# Patient Record
Sex: Male | Born: 1980 | Hispanic: Yes | Marital: Married | Smoking: Never smoker
Health system: Southern US, Community
[De-identification: ages and names within clinical notes are randomized; demographics above are authoritative.]

## PROBLEM LIST (undated history)

## (undated) DIAGNOSIS — I10 Essential (primary) hypertension: Secondary | ICD-10-CM

---

## 2020-07-09 ENCOUNTER — Inpatient Hospital Stay
Admission: EM | Admit: 2020-07-09 | Discharge: 2020-07-12 | DRG: 440 | Disposition: A | Discharge status: Home / Self Care | Payer: Self-pay | Attending: Internal Medicine | Admitting: Internal Medicine

## 2020-07-09 ENCOUNTER — Emergency Department: Payer: Self-pay

## 2020-07-09 ENCOUNTER — Other Ambulatory Visit: Payer: Self-pay

## 2020-07-09 DIAGNOSIS — F10239 Alcohol dependence with withdrawal, unspecified: Secondary | ICD-10-CM

## 2020-07-09 DIAGNOSIS — R7401 Elevation of levels of liver transaminase levels: Secondary | ICD-10-CM | POA: Diagnosis present

## 2020-07-09 DIAGNOSIS — Z20822 Contact with and (suspected) exposure to covid-19: Secondary | ICD-10-CM | POA: Diagnosis present

## 2020-07-09 DIAGNOSIS — K859 Acute pancreatitis without necrosis or infection, unspecified: Secondary | ICD-10-CM

## 2020-07-09 DIAGNOSIS — Z8249 Family history of ischemic heart disease and other diseases of the circulatory system: Secondary | ICD-10-CM

## 2020-07-09 DIAGNOSIS — K8020 Calculus of gallbladder without cholecystitis without obstruction: Secondary | ICD-10-CM

## 2020-07-09 DIAGNOSIS — F1020 Alcohol dependence, uncomplicated: Secondary | ICD-10-CM | POA: Diagnosis present

## 2020-07-09 DIAGNOSIS — E860 Dehydration: Secondary | ICD-10-CM

## 2020-07-09 DIAGNOSIS — F10939 Alcohol use, unspecified with withdrawal, unspecified: Secondary | ICD-10-CM

## 2020-07-09 DIAGNOSIS — K760 Fatty (change of) liver, not elsewhere classified: Secondary | ICD-10-CM | POA: Diagnosis present

## 2020-07-09 DIAGNOSIS — R109 Unspecified abdominal pain: Secondary | ICD-10-CM

## 2020-07-09 DIAGNOSIS — I10 Essential (primary) hypertension: Secondary | ICD-10-CM | POA: Diagnosis present

## 2020-07-09 DIAGNOSIS — K852 Alcohol induced acute pancreatitis without necrosis or infection: Principal | ICD-10-CM | POA: Diagnosis present

## 2020-07-09 DIAGNOSIS — K7010 Alcoholic hepatitis without ascites: Secondary | ICD-10-CM | POA: Diagnosis present

## 2020-07-09 DIAGNOSIS — F1010 Alcohol abuse, uncomplicated: Secondary | ICD-10-CM

## 2020-07-09 HISTORY — DX: Essential (primary) hypertension: I10

## 2020-07-09 LAB — COMPREHENSIVE METABOLIC PANEL
ALT: 47 U/L — ABNORMAL HIGH (ref 0–44)
AST: 85 U/L — ABNORMAL HIGH (ref 15–41)
Albumin: 4.5 g/dL (ref 3.5–5.0)
Alkaline Phosphatase: 104 U/L (ref 38–126)
Anion gap: 15 (ref 5–15)
BUN: 15 mg/dL (ref 6–20)
CO2: 20 mmol/L — ABNORMAL LOW (ref 22–32)
Calcium: 9.3 mg/dL (ref 8.9–10.3)
Chloride: 97 mmol/L — ABNORMAL LOW (ref 98–111)
Creatinine, Ser: 1.41 mg/dL — ABNORMAL HIGH (ref 0.61–1.24)
GFR, Estimated: >60 mL/min (ref >60)
Glucose, Bld: 130 mg/dL — ABNORMAL HIGH (ref 70–99)
Potassium: 3.8 mmol/L (ref 3.5–5.1)
Sodium: 132 mmol/L — ABNORMAL LOW (ref 135–145)
Total Bilirubin: 2.3 mg/dL — ABNORMAL HIGH (ref 0.3–1.2)
Total Protein: 8.6 g/dL — ABNORMAL HIGH (ref 6.5–8.1)

## 2020-07-09 LAB — MAGNESIUM: Magnesium: 1.9 mg/dL (ref 1.7–2.4)

## 2020-07-09 LAB — CBC
HCT: 49 % (ref 39.0–52.0)
Hemoglobin: 17.7 g/dL — ABNORMAL HIGH (ref 13.0–17.0)
MCH: 30.3 pg (ref 26.0–34.0)
MCHC: 36.1 g/dL — ABNORMAL HIGH (ref 30.0–36.0)
MCV: 83.8 fL (ref 80.0–100.0)
Platelets: 240 10*3/uL (ref 150–400)
RBC: 5.85 MIL/uL — ABNORMAL HIGH (ref 4.22–5.81)
RDW: 12.4 % (ref 11.5–15.5)
WBC: 11.8 10*3/uL — ABNORMAL HIGH (ref 4.0–10.5)
nRBC: 0 % (ref 0.0–0.2)

## 2020-07-09 LAB — PHOSPHORUS: Phosphorus: 4.6 mg/dL (ref 2.5–4.6)

## 2020-07-09 LAB — ETHANOL: Alcohol, Ethyl (B): <10 mg/dL (ref <10)

## 2020-07-09 LAB — LIPASE, BLOOD: Lipase: 115 U/L — ABNORMAL HIGH (ref 11–51)

## 2020-07-09 IMAGING — US US ABDOMEN LIMITED
1 series · 14 of 25 positions shown · non-contrast
Comparison: CT earlier today.

CLINICAL DATA: Abdominal pain.

EXAM:
ULTRASOUND ABDOMEN LIMITED RIGHT UPPER QUADRANT

[Series 1: us abdomen limited ruq (liver/gb) · 14 of 29 slices shown]
[im 1/29]
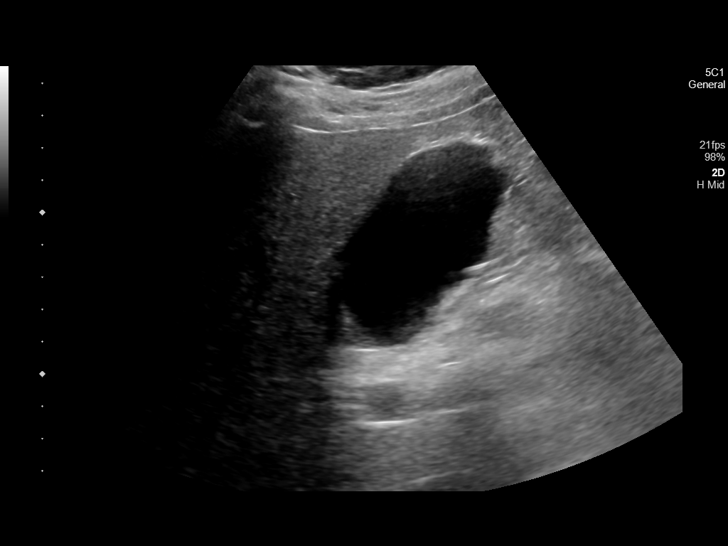
[im 3/29]
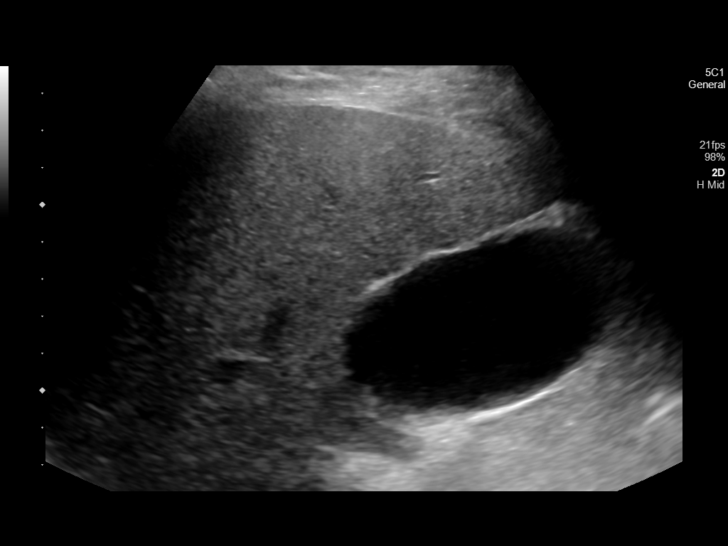
[im 5/29]
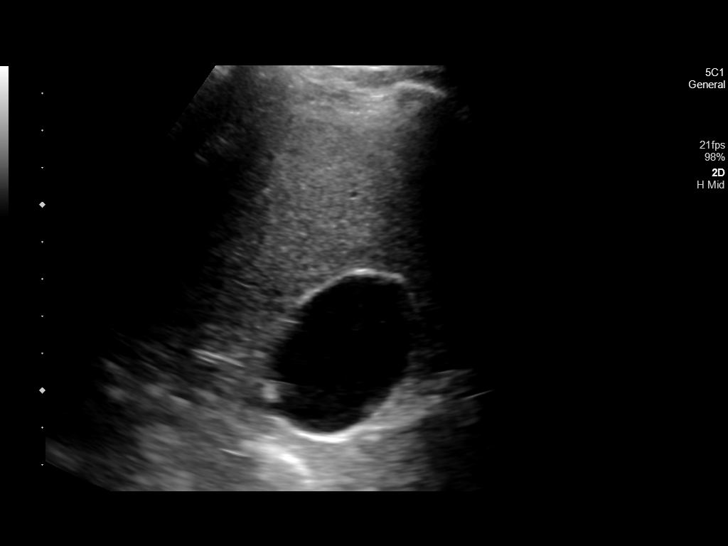
[im 8/29]
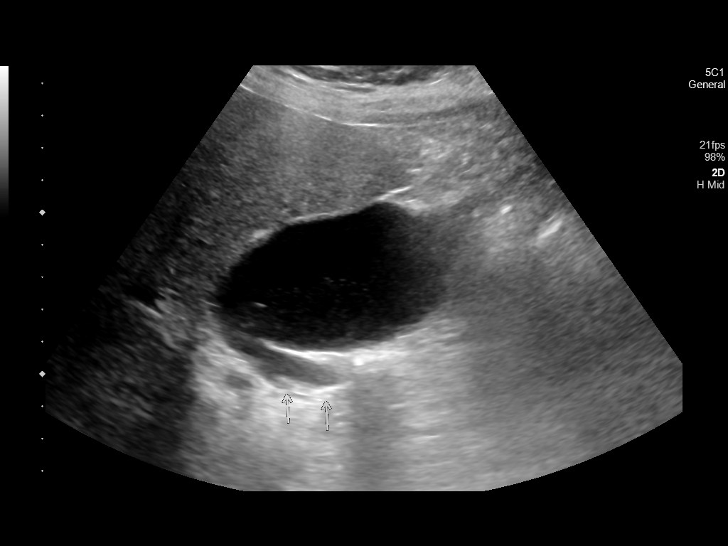
[im 10/29]
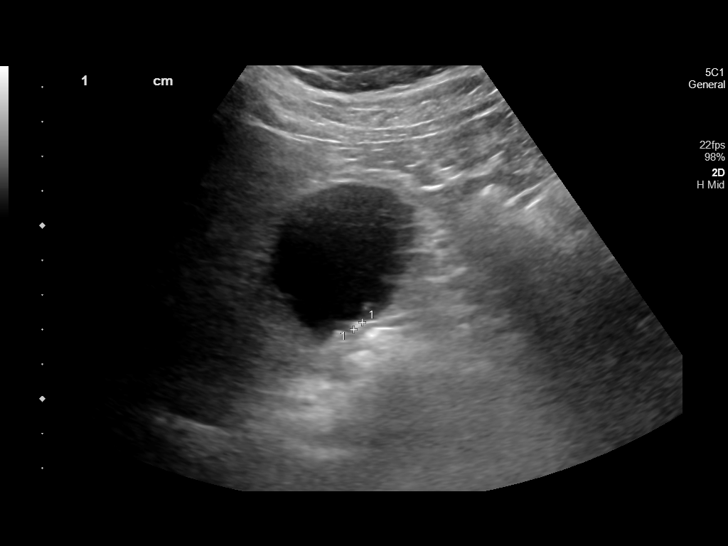
[im 11/29]
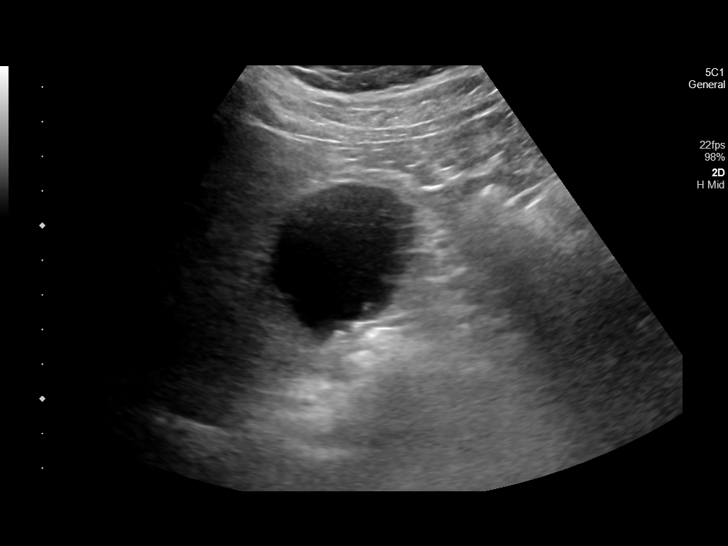
[im 13/29]
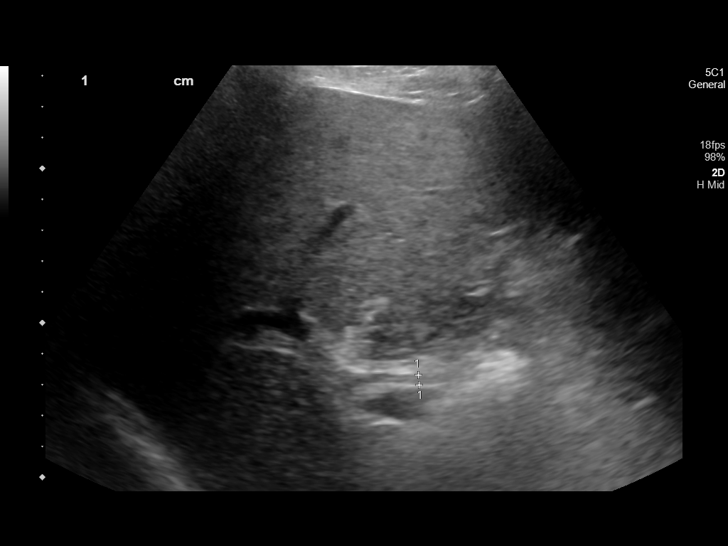
[im 16/29]
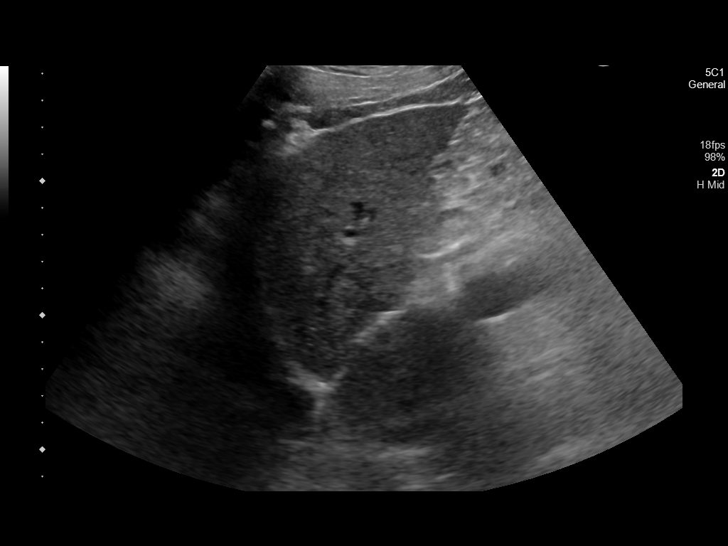
[im 18/29]
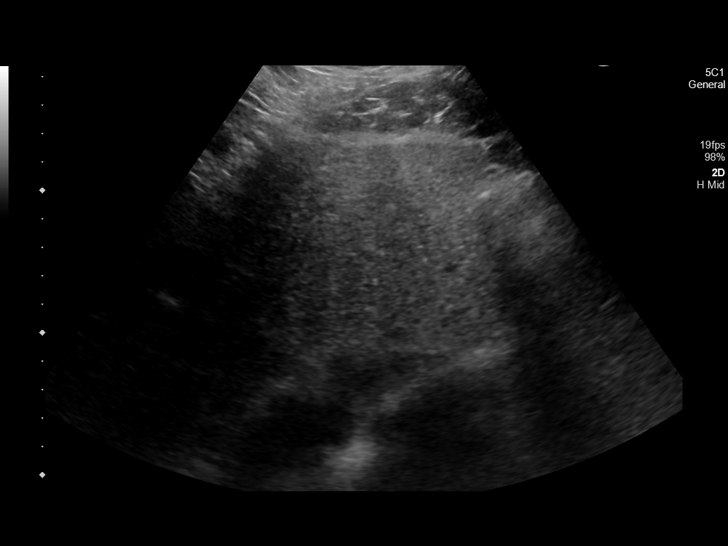
[im 19/29]
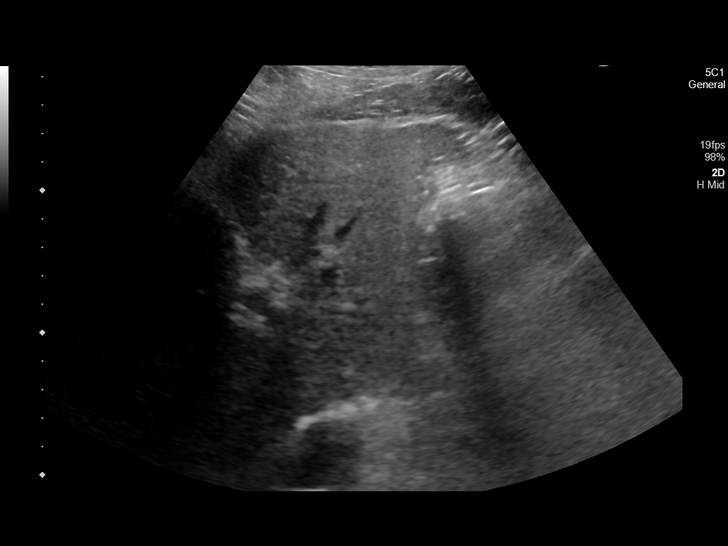
[im 22/29]
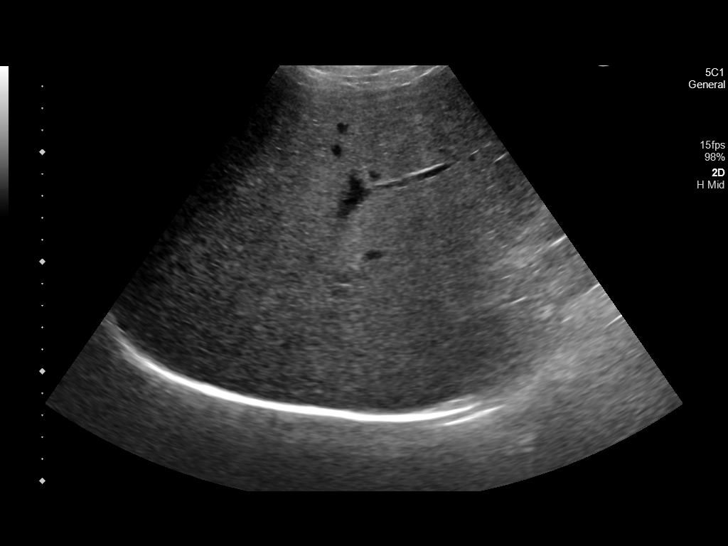
[im 24/29]
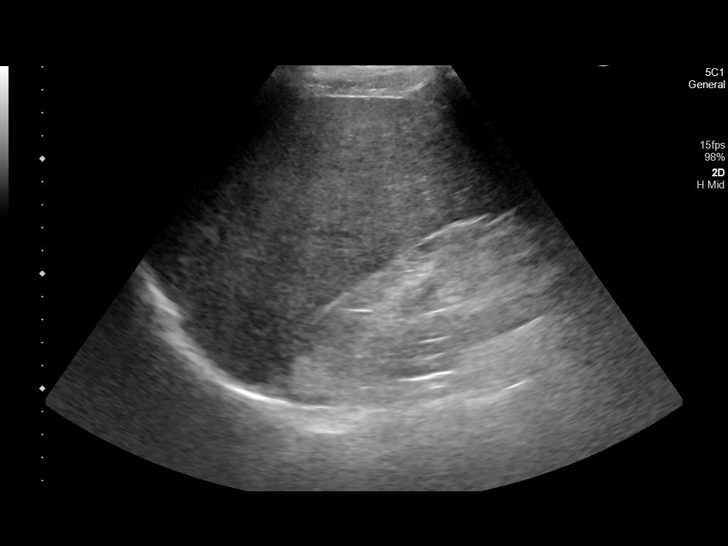
[im 26/29]
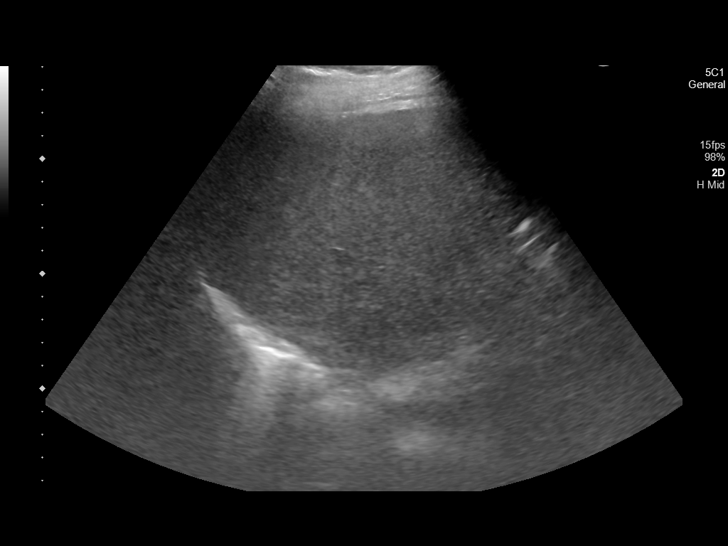
[im 29/29]
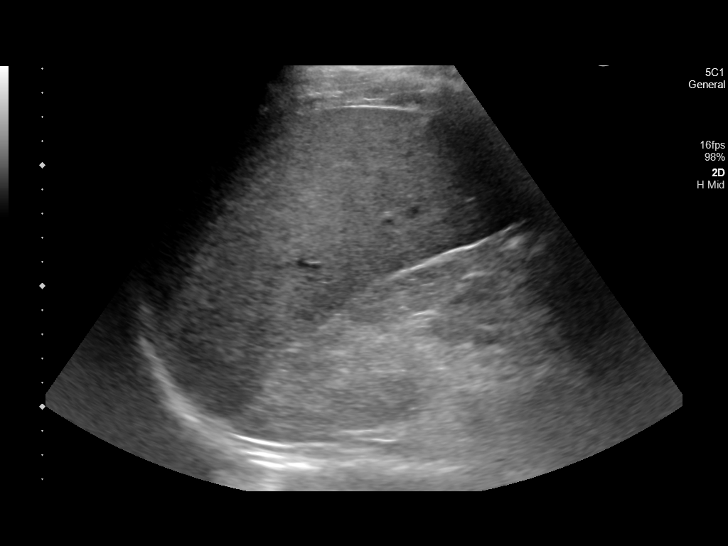

[14 of 25 positions shown; findings below may reference images not displayed]

FINDINGS: Gallbladder:

Distended with stones at the gallbladder neck. Suspected layering
sludge. No wall thickening or pericholecystic fluid. No sonographic
Murphy sign noted by sonographer.

Common bile duct:

Diameter: 3 mm at the porta hepatis, not well visualized distally

Liver:

Heterogeneous echogenicity with likely steatosis. The right kidney
is markedly atrophic for comparison. No evidence of focal lesion.
Portal vein is patent on color Doppler imaging with normal direction
of blood flow towards the liver.

Other: No right upper quadrant ascites.
IMPRESSION: 1. Distended gallbladder with gallstones but no sonographic findings
of acute cholecystitis.
2. Heterogeneous liver echogenicity with likely steatosis.

## 2020-07-09 IMAGING — CT CT ABD-PELV W/O CM
2 of 4 series · 16 of 46 positions shown, 18 images · non-contrast
Comparison: None.

CLINICAL DATA: Vomiting right-sided abdominal pain

EXAM:
CT ABDOMEN AND PELVIS WITHOUT CONTRAST
TECHNIQUE: Multidetector CT imaging of the abdomen and pelvis was performed
following the standard protocol without IV contrast.

[Series 2: routine abd/pel wo · axial · 0.93mm/px · z∈[-1008,-483]mm · 13 of 115 slices shown, 15 images]
[im 5/115  soft-tissue]
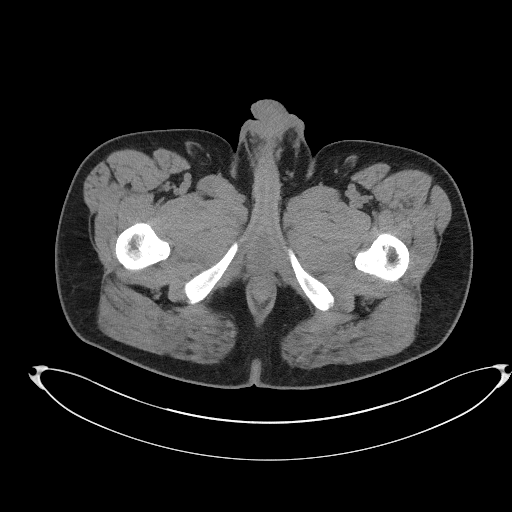
[im 5/115  bone]
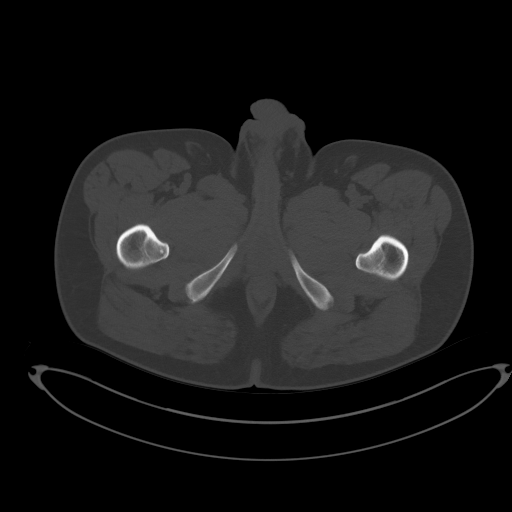
[im 15/115  soft-tissue]
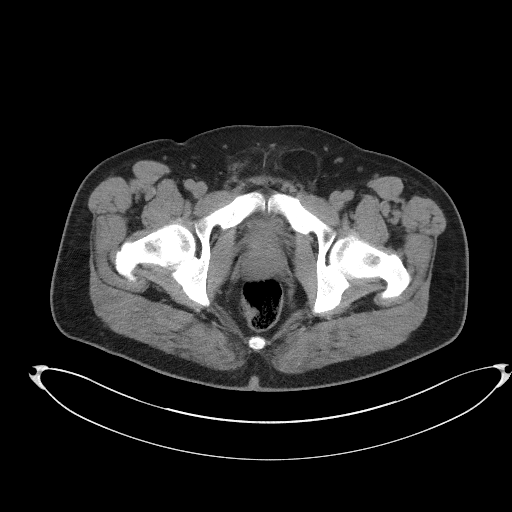
[im 25/115  soft-tissue]
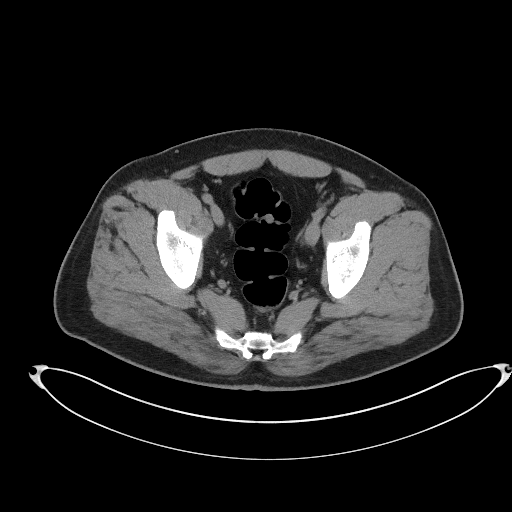
[im 30/115  soft-tissue]
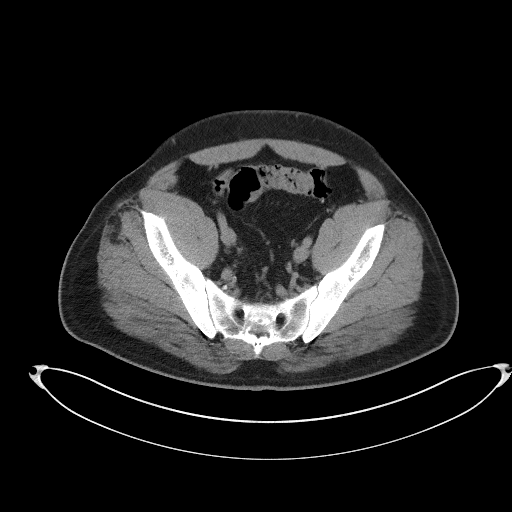
[im 40/115  soft-tissue]
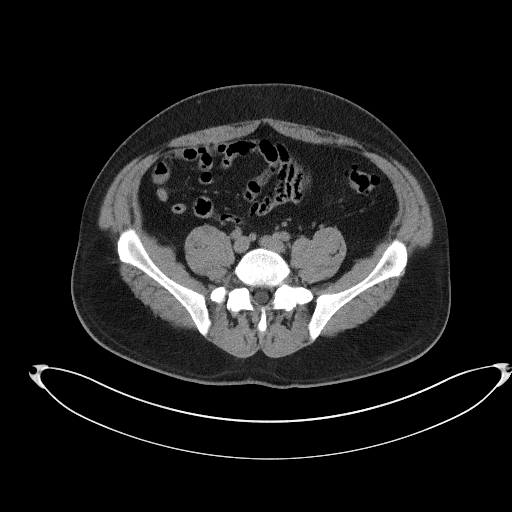
[im 50/115  soft-tissue]
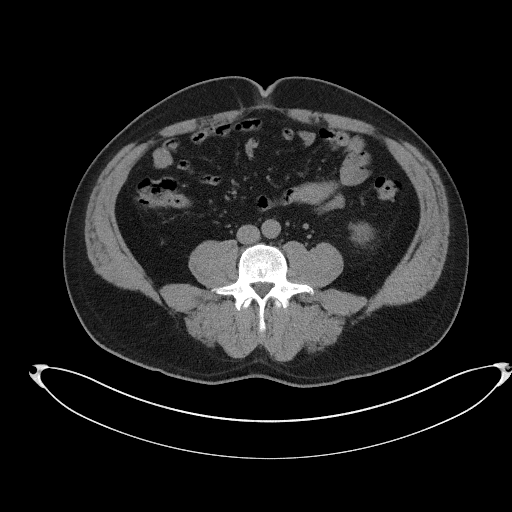
[im 60/115  soft-tissue]
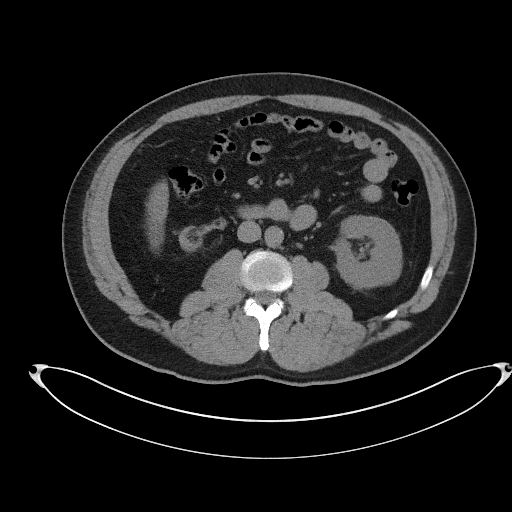
[im 65/115  soft-tissue]
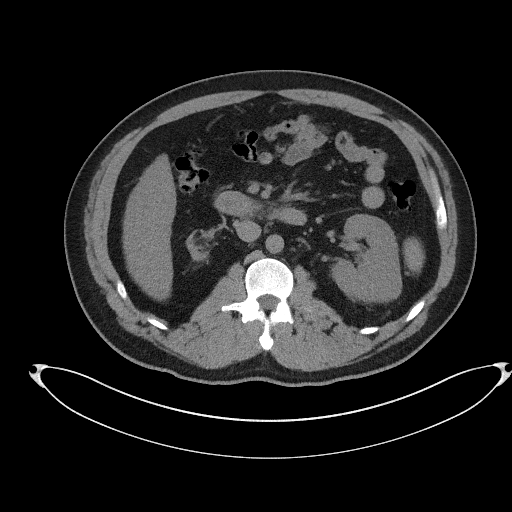
[im 75/115  soft-tissue]
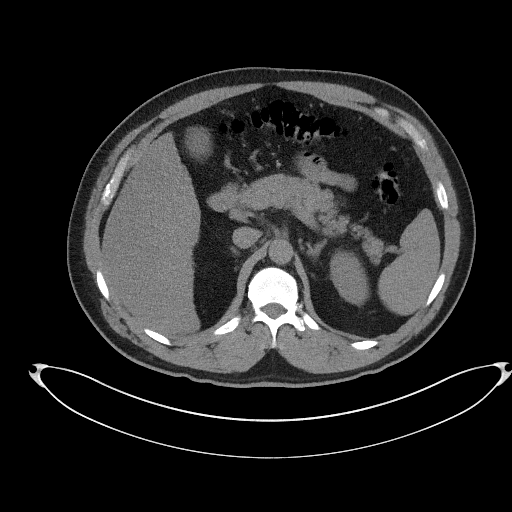
[im 75/115  bone]
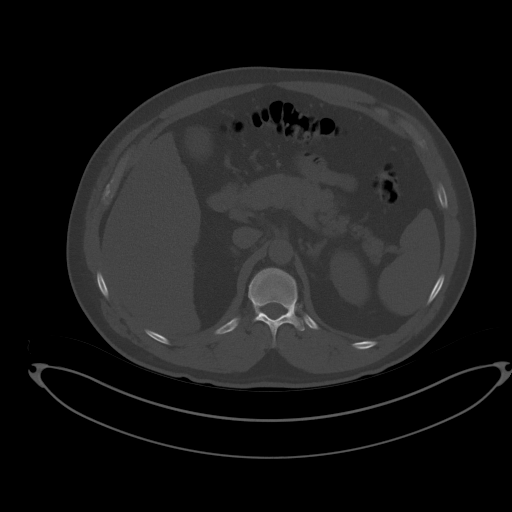
[im 85/115  soft-tissue]
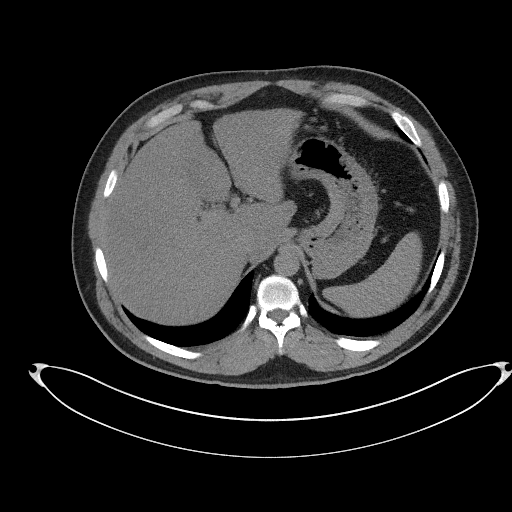
[im 90/115  soft-tissue]
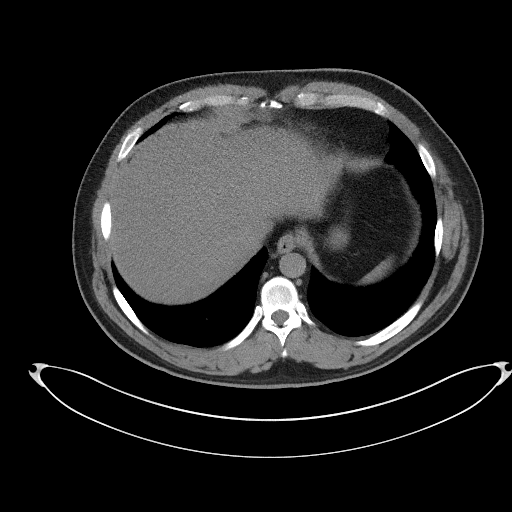
[im 100/115  soft-tissue]
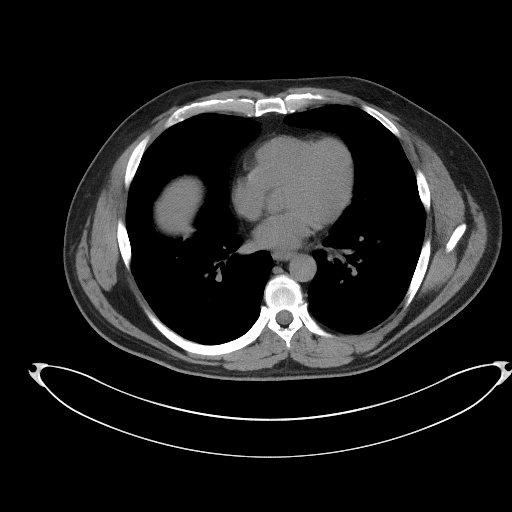
[im 110/115  soft-tissue]
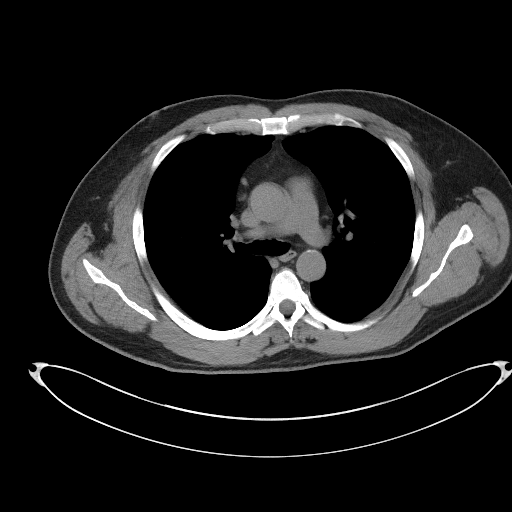

[Series 5: coronal st · coronal · 0.87mm/px · 3 of 103 slices shown]
[im 35/103  soft-tissue]
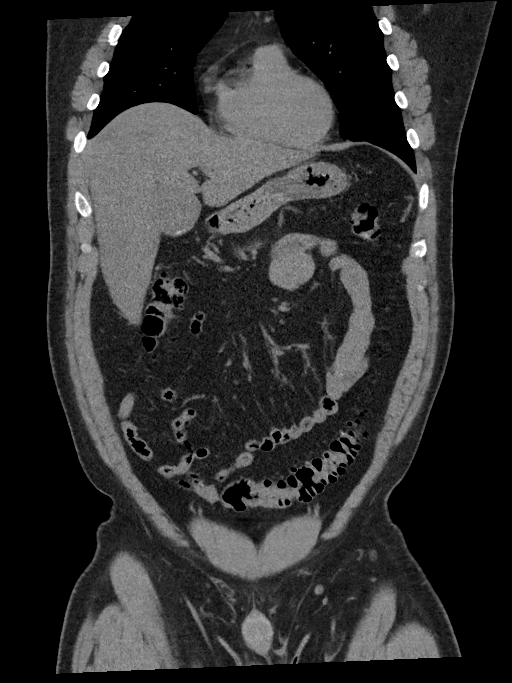
[im 46/103  soft-tissue]
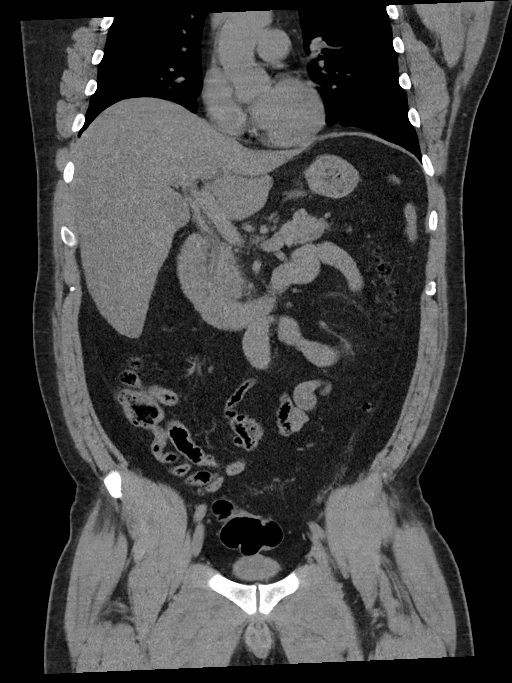
[im 57/103  soft-tissue]
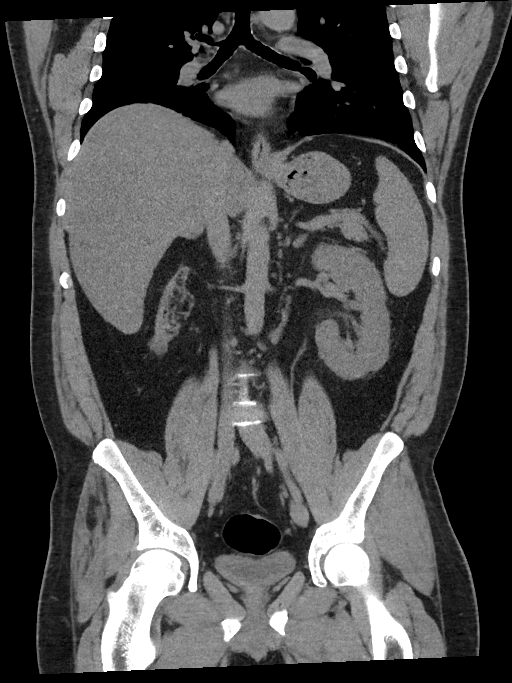

[16 of 46 positions shown; findings below may reference images not displayed]

FINDINGS: Lower chest: Lung bases demonstrate no acute consolidation or
effusion. Mild elevation of right diaphragm.

Hepatobiliary: Liver slightly enlarged at 21 cm. Possible subtle
contour nodularity. Gallbladder slightly dilated. Small stones
within the gallbladder neck and possibly the cystic duct. No right
upper quadrant inflammatory change.

Pancreas: Unremarkable. No pancreatic ductal dilatation or
surrounding inflammatory changes.

Spleen: Normal in size without focal abnormality.

Adrenals/Urinary Tract: Adrenal glands are normal. Atrophic right
kidney with punctate stone at the upper pole. Exophytic
low-attenuation lesion off the lower pole probably a cyst. No
hydronephrosis. The bladder is normal

Stomach/Bowel: Stomach is within normal limits. Appendix appears
normal. No evidence of bowel wall thickening, distention, or
inflammatory changes. Diverticular disease of the colon without
acute wall thickening.

Vascular/Lymphatic: Mild aortic atherosclerosis. No aneurysm. No
suspicious nodes

Reproductive: Prostate is unremarkable.

Other: Negative for free air or free fluid. Left fat containing
inguinal hernia

Musculoskeletal: No acute or significant osseous findings.
IMPRESSION: 1. Slightly enlarged liver. Possible subtle contour nodularity of
the liver as may be seen with cirrhosis.
2. Slightly distended gallbladder containing stones. Stones appear
to be within gallbladder neck and possibly cystic duct. Correlation
with right upper quadrant ultrasound may be obtained if symptoms are
referable to the gallbladder
3. Atrophic right kidney with stone.  No hydronephrosis
4. Diverticular disease of the colon without acute inflammatory
change

## 2020-07-09 MED ORDER — ADULT MULTIVITAMIN W/MINERALS CH
1.0000 | ORAL_TABLET | Freq: Every day | ORAL | Status: DC
  Administered 2020-07-09 – 2020-07-12 (×4): 1 via ORAL
  Filled 2020-07-09 (×4): qty 1

## 2020-07-09 MED ORDER — LORAZEPAM 2 MG/ML IJ SOLN
2.0000 mg | Freq: Once | INTRAMUSCULAR | Status: AC
  Administered 2020-07-09: 2 mg via INTRAVENOUS
  Filled 2020-07-09: qty 1

## 2020-07-09 MED ORDER — LORAZEPAM 2 MG/ML IJ SOLN: 1.0000 mg | INTRAMUSCULAR | Status: DC | PRN

## 2020-07-09 MED ORDER — FOLIC ACID 1 MG PO TABS
1.0000 mg | ORAL_TABLET | Freq: Every day | ORAL | Status: DC
  Administered 2020-07-09 – 2020-07-12 (×4): 1 mg via ORAL
  Filled 2020-07-09 (×4): qty 1

## 2020-07-09 MED ORDER — ONDANSETRON HCL 4 MG/2ML IJ SOLN
4.0000 mg | INTRAMUSCULAR | Status: AC
  Administered 2020-07-09: 4 mg via INTRAVENOUS
  Filled 2020-07-09: qty 2

## 2020-07-09 MED ORDER — THIAMINE HCL 100 MG PO TABS
100.0000 mg | ORAL_TABLET | Freq: Every day | ORAL | Status: DC
  Administered 2020-07-09 – 2020-07-12 (×4): 100 mg via ORAL
  Filled 2020-07-09 (×4): qty 1

## 2020-07-09 MED ORDER — LORAZEPAM 1 MG PO TABS
1.0000 mg | ORAL_TABLET | ORAL | Status: DC | PRN
  Administered 2020-07-09: 2 mg via ORAL
  Filled 2020-07-09: qty 2

## 2020-07-09 MED ORDER — SODIUM CHLORIDE 0.9 % IV BOLUS
1000.0000 mL | Freq: Once | INTRAVENOUS | Status: AC
  Administered 2020-07-09: 1000 mL via INTRAVENOUS

## 2020-07-09 MED ORDER — THIAMINE HCL 100 MG/ML IJ SOLN
100.0000 mg | Freq: Every day | INTRAMUSCULAR | Status: DC
  Filled 2020-07-09: qty 2

## 2020-07-09 MED ORDER — MORPHINE SULFATE (PF) 4 MG/ML IV SOLN: 4.0000 mg | Freq: Once | INTRAVENOUS | Status: DC

## 2020-07-09 NOTE — ED Notes (Signed)
EKG to EDP Isaacs in person.  

## 2020-07-09 NOTE — ED Notes (Signed)
Pt given urine specimen cup and lav/grn/red tubes sent to lab.

## 2020-07-09 NOTE — ED Notes (Signed)
Reports he does not have abdominal pain at this time, morphine not given.

## 2020-07-09 NOTE — ED Notes (Signed)
Called lab re: lipase and ETOH add on.  They will run immed

## 2020-07-09 NOTE — ED Provider Notes (Signed)
-----------------------------------------   11:18 PM on 07/09/2020 -----------------------------------------  Assuming care from Dr. Fanny Bien.  In short, Joshua Yoder is a 40 y.o. male with a chief complaint of abdominal pain w/ N/V.  Refer to the original H&P for additional details.  The current plan of care is to follow up ultrasound.  Anticipate admission.    ----------------------------------------- 12:45 AM on 07/10/2020 -----------------------------------------   Ultrasound showed a 3 mm common bile duct and cholelithiasis without evidence of cholecystitis.  I ordered the MRCP and I contacted the hospitalist for admission but he pointed out that the patient may need an ERCP, which is only available if Dr. Servando Snare is around, which she does not seem to be over the weekend.  I called and spoke by phone with Dr. Norma Fredrickson with gastroenterology.  He agreed that we should go ahead and get the MRCP.  If positive for choledocholithiasis, the patient will likely need to be transferred to a facility with ERCP available over the weekend.  If the MRCP is negative, we can admit to the hospitalist service.  Dr. Norma Fredrickson pointed out that if it is negative, gastroenterology likely does not need to be contacted by the hospitalist service, as the hospital service can treat the alcohol withdrawal, the pancreatitis, and consult surgery as needed for alcoholic or gallbladder pancreatitis.  I updated Dr. Allena Katz with the hospitalist service and order the MRCP and what that will be the decision point.  I also ordered 1 L lactated Ringer's in addition to the fluids he is already received.    ----------------------------------------- 4:58 AM on 07/10/2020 -----------------------------------------  Discussed case in person with Dr. Para March who will admit the patient.   Joshua Rose, MD 07/10/20 [phone]

## 2020-07-09 NOTE — ED Triage Notes (Signed)
Pt in from home d/t R sided abd pain. Told by a healthcare provider previously that it is due to all of the ETOH use. Pain x3 days per pt. Pt c/o throat discomfort, weakness, and shakiness. States last drink was Friday. Reports has HTN and currently has a HA. Reports stopped drinking in January but then started back up Friday.

## 2020-07-09 NOTE — ED Notes (Signed)
[id number]. Interpreting system used during triage.

## 2020-07-09 NOTE — ED Provider Notes (Signed)
[facility] Emergency Department Provider Note   ____________________________________________   Event Date/Time   First MD Initiated Contact with Patient 07/09/20 2056     (approximate)  I have reviewed the triage vital signs and the nursing notes.   HISTORY  Chief Complaint Abdominal Pain (R side of abd)  Spanish interpreter [place] utilized  HPI Joshua Yoder is a 40 y.o. male history of hypertension  Patient reports that he has been having pain in his mid upper abdomen for a couple days  maybe 3 days.  It is located primarily in his mid abdomen to right upper abdomen.  He also reports he feels very shaky, fatigued, nauseated and has vomited several times  In addition to this, he reports initially that he had drank about 4 large beers daily for the last 4 to 5 days, but on further than also reports that he drinks about a liter of tequila as well daily.  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  No chest pain or shortness of breath.  Reports nausea vomiting upper abdominal pain.  He also reports feeling like he has a "hangover" feeling light weak headed tremulous.  Denies history of withdrawal seizures.  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 Hypertension     There are no problems to display for this patient.   History reviewed. No pertinent surgical history.  Prior to Admission medications   Not on File    Allergies Patient has no allergy information on record.  No family history on file.  Social History Social History   Tobacco Use  . Smoking status: Never Smoker  . Smokeless tobacco: Never Used  Substance Use Topics  . Alcohol use: Yes  . Drug use: Not Currently    Review of Systems Constitutional: No  fever/chills Eyes: No visual changes. ENT: No sore throat. Cardiovascular: Denies chest pain. Respiratory: Denies shortness of breath. Gastrointestinal: See HPI Genitourinary: Negative for dysuria. Musculoskeletal: Negative for back pain. Skin: Negative for rash. Neurological: Negative for headaches, areas of focal weakness or numbness.  Feels tremulous.    ____________________________________________   PHYSICAL EXAM:  VITAL SIGNS: ED Triage Vitals  Enc Vitals Group     BP 07/09/20 1734 (!) 143/100     Pulse Rate 07/09/20 1734 (!) 132     Resp 07/09/20 1734 18     Temp 07/09/20 1734 97.6 F (36.4 C)     Temp Source 07/09/20 1734 Oral     SpO2 07/09/20 1734 97 %     Weight 07/09/20 1744 175 lb (79.4 kg)     Height 07/09/20 1744 5\' 7"  (1.702 m)     Head Circumference --      Peak Flow --      Pain Score 07/09/20 1744 5     Pain Loc --      Pain Edu? --      Excl. in GC? --     Constitutional: Alert and oriented.  Mildly ill-appearing.  No acute distress though. Eyes: Conjunctivae are slightly injected bilateral. Head: Atraumatic. Nose: No congestion/rhinnorhea. Mouth/Throat: Mucous membranes are moist. Neck: No stridor.  Cardiovascular: Tachycardic rate, regular rhythm. Grossly normal heart sounds.  Good peripheral circulation. Respiratory: Normal respiratory  effort.  No retractions. Lungs CTAB. Gastrointestinal: Soft.  Nondistended.  Reports moderate tenderness in the epigastrium right mid abdomen.  No rebound or guarding. Musculoskeletal: No lower extremity tenderness nor edema. Neurologic:  Normal speech and language. No gross focal neurologic deficits are appreciated.  Slightly tremulous in his hands. Skin:  Skin is warm, dry and intact. No rash noted. Psychiatric: Mood and affect are normal. Speech and behavior are normal.  ____________________________________________   LABS (all labs ordered are listed, but only abnormal results are displayed)  Labs  Reviewed  COMPREHENSIVE METABOLIC PANEL - Abnormal; Notable for the following components:      Result Value   Sodium 132 (*)    Chloride 97 (*)    CO2 20 (*)    Glucose, Bld 130 (*)    Creatinine, Ser 1.41 (*)    Total Protein 8.6 (*)    AST 85 (*)    ALT 47 (*)    Total Bilirubin 2.3 (*)    All other components within normal limits  CBC - Abnormal; Notable for the following components:   WBC 11.8 (*)    RBC 5.85 (*)    Hemoglobin 17.7 (*)    MCHC 36.1 (*)    All other components within normal limits  LIPASE, BLOOD - Abnormal; Notable for the following components:   Lipase 115 (*)    All other components within normal limits  RESP PANEL BY RT-PCR (FLU A&B, COVID) ARPGX2  ETHANOL  MAGNESIUM  PHOSPHORUS  LACTIC ACID, PLASMA   ____________________________________________  EKG EKG is reviewed inter by me Sinus tach rate 120 No obvious acute ischemic changes PR interval 142 ms QRS duration 82 ms QT/QTcB 320/454 ms ____________________________________________  RADIOLOGY  CT ABDOMEN PELVIS WO CONTRAST  Result Date: 07/09/2020 CLINICAL DATA:  Vomiting right-sided abdominal pain EXAM: CT ABDOMEN AND PELVIS WITHOUT CONTRAST TECHNIQUE: Multidetector CT imaging of the abdomen and pelvis was performed following the standard protocol without IV contrast. COMPARISON:  None. FINDINGS: Lower chest: Lung bases demonstrate no acute consolidation or effusion. Mild elevation of right diaphragm. Hepatobiliary: Liver slightly enlarged at 21 cm. Possible subtle contour nodularity. Gallbladder slightly dilated. Small stones within the gallbladder neck and possibly the cystic duct. No right upper quadrant inflammatory change. Pancreas: Unremarkable. No pancreatic ductal dilatation or surrounding inflammatory changes. Spleen: Normal in size without focal abnormality. Adrenals/Urinary Tract: Adrenal glands are normal. Atrophic right kidney with punctate stone at the upper pole. Exophytic  low-attenuation lesion off the lower pole probably a cyst. No hydronephrosis. The bladder is normal Stomach/Bowel: Stomach is within normal limits. Appendix appears normal. No evidence of bowel wall thickening, distention, or inflammatory changes. Diverticular disease of the colon without acute wall thickening. Vascular/Lymphatic: Mild aortic atherosclerosis. No aneurysm. No suspicious nodes Reproductive: Prostate is unremarkable. Other: Negative for free air or free fluid. Left fat containing inguinal hernia Musculoskeletal: No acute or significant osseous findings. IMPRESSION: 1. Slightly enlarged liver. Possible subtle contour nodularity of the liver as may be seen with cirrhosis. 2. Slightly distended gallbladder containing stones. Stones appear to be within gallbladder neck and possibly cystic duct. Correlation with right upper quadrant ultrasound may be obtained if symptoms are referable to the gallbladder 3. Atrophic right kidney with stone.  No hydronephrosis 4. 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   On: 07/09/2020 22:21    CT imaging reviewed notable for slightly enlarged liver.  Distended gallbladder containing stones.  Also  concern for possible stone in the cystic duct.  Right upper quadrant ultrasound recommended by the radiologist, this has been ordered ____________________________________________   PROCEDURES  Procedure(s) performed: None  Procedures  Critical Care performed: No  ____________________________________________   INITIAL IMPRESSION / ASSESSMENT AND PLAN / ED COURSE  Pertinent labs & imaging results that were available during my care of the patient were reviewed by me and considered in my medical decision making (see chart for details).   Differential diagnosis includes but is not limited to, abdominal perforation, aortic dissection, cholecystitis, appendicitis, diverticulitis, colitis,  esophagitis/gastritis, kidney stone, pyelonephritis, urinary tract infection, aortic aneurysm. All are considered in decision and treatment plan. Based upon the patient's presentation and risk factors, as well as the patient's history of heavy alcohol use proceed with CT.  Also anticipate hydration, antiemetic, pain relief etc.  Patient also demonstrates mild tremulousness slight tachycardia, also placed on withdrawal protocol for history of heavy alcohol use drinking up to a liter of hard liquor daily.  No associated cardiac or pulmonary symptoms.  Afebrile.  ----------------------------------------- 11:37 PM on 07/09/2020 -----------------------------------------  Labs indicate mild transaminitis slightly elevated bilirubin.  Right upper quadrant ultrasound ordered to evaluate for possible obstructive etiology cholecystitis etc.  Also mild pancreatitis is noted.  Anticipate need for admission to the hospital, but pending right upper quadrant at this time.  Ongoing care signed to Dr. York Cerise with plan for likely need for admission, may need consideration for surgical consult or MRCP as well pending ultrasound.  Hemodynamics improving.  Resting comfortably at this time.        ____________________________________________   FINAL CLINICAL IMPRESSION(S) / ED DIAGNOSES  Final diagnoses:  Abdominal pain  Right upper quadrant pain. Transaminitis Hyperbilirubinemia      Note:  This document was prepared using Dragon voice recognition software and may include unintentional dictation errors       Sharyn Creamer, MD 07/09/20 2338

## 2020-07-09 NOTE — ED Notes (Signed)
Patient transported to CT 

## 2020-07-10 ENCOUNTER — Encounter: Payer: Self-pay | Admitting: Internal Medicine

## 2020-07-10 ENCOUNTER — Other Ambulatory Visit: Payer: Self-pay

## 2020-07-10 ENCOUNTER — Emergency Department: Payer: Self-pay

## 2020-07-10 DIAGNOSIS — F1020 Alcohol dependence, uncomplicated: Secondary | ICD-10-CM | POA: Diagnosis present

## 2020-07-10 DIAGNOSIS — K8020 Calculus of gallbladder without cholecystitis without obstruction: Secondary | ICD-10-CM | POA: Diagnosis present

## 2020-07-10 DIAGNOSIS — I10 Essential (primary) hypertension: Secondary | ICD-10-CM | POA: Diagnosis present

## 2020-07-10 DIAGNOSIS — R7401 Elevation of levels of liver transaminase levels: Secondary | ICD-10-CM | POA: Diagnosis present

## 2020-07-10 DIAGNOSIS — K852 Alcohol induced acute pancreatitis without necrosis or infection: Secondary | ICD-10-CM | POA: Diagnosis present

## 2020-07-10 LAB — LACTIC ACID, PLASMA: Lactic Acid, Venous: 2 mmol/L (ref 0.5–1.9)

## 2020-07-10 LAB — RESP PANEL BY RT-PCR (FLU A&B, COVID) ARPGX2
Influenza A by PCR: NEGATIVE
Influenza B by PCR: NEGATIVE
SARS Coronavirus 2 by RT PCR: NEGATIVE

## 2020-07-10 LAB — MAGNESIUM: Magnesium: 1.8 mg/dL (ref 1.7–2.4)

## 2020-07-10 LAB — HIV ANTIBODY (ROUTINE TESTING W REFLEX): HIV Screen 4th Generation wRfx: NONREACTIVE

## 2020-07-10 LAB — GLUCOSE, CAPILLARY
Glucose-Capillary: 124 mg/dL — ABNORMAL HIGH (ref 70–99)
Glucose-Capillary: 114 mg/dL — ABNORMAL HIGH (ref 70–99)

## 2020-07-10 IMAGING — MR MR ABDOMEN WO/W CM MRCP
20 of 22 series · 44 of 48 positions shown · IV contrast (7ml Gadavist)
Comparison: [DATE]

CLINICAL DATA: Cholelithiasis, pancreatitis

EXAM:
MRI ABDOMEN WITHOUT AND WITH CONTRAST (INCLUDING MRCP)
TECHNIQUE: Multiplanar multisequence MR imaging of the abdomen was performed
both before and after the administration of intravenous contrast.
Heavily T2-weighted images of the biliary and pancreatic ducts were
obtained, and three-dimensional MRCP images were rendered by post
processing.
CONTRAST:  7.5mL GADAVIST GADOBUTROL 1 MMOL/ML IV SOLN

[Series 2: T2 · coronal · 6.0mm · 1.25mm/px · 1 of 38 slices shown (1 of 2)]
[im 1/38]
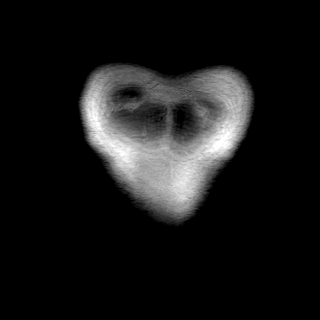

[Series 3: T2 · axial · 6.0mm · 1.31mm/px · 1 of 40 slices shown (2 of 2)]
[im 1/40]
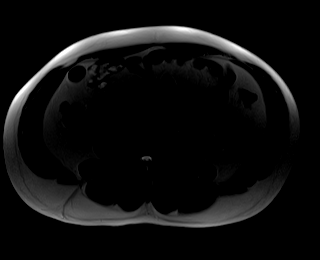

[Series 6: T2 fat-sat · axial · 6.0mm · 1.31mm/px · 1 of 40 slices shown]
[im 1/40]
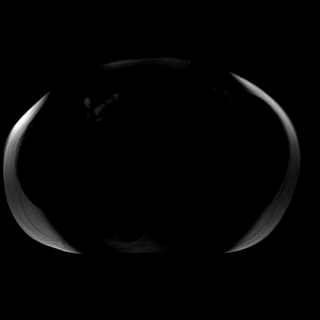

[Series 7: T1 · axial · 6.0mm · 0.82mm/px · 1 of 40 slices shown (1 of 2)]
[im 1/40]
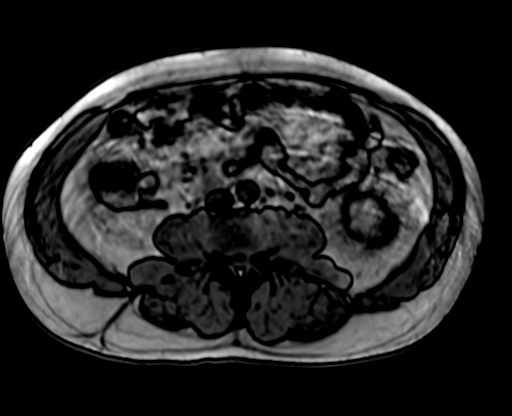

[Series 7: T1 · axial · 6.0mm · 0.82mm/px · 1 of 40 slices shown (2 of 2)]
[im 1/40]
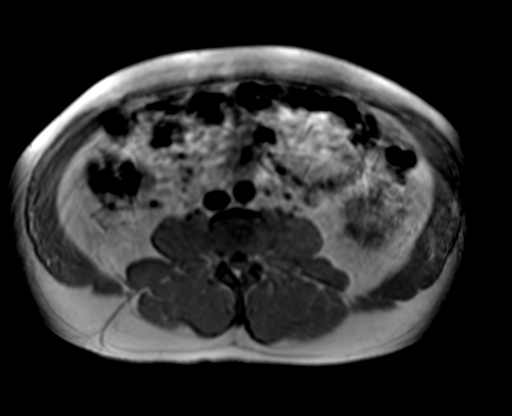

[Series 8: ax dwi_tracew · axial · 6.0mm · 1.57mm/px · z∈[-55,+226]mm · 3 of 120 slices shown]
[im 1/120]
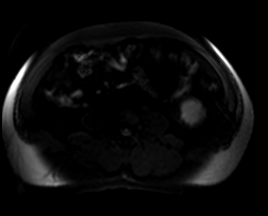
[im 60/120]
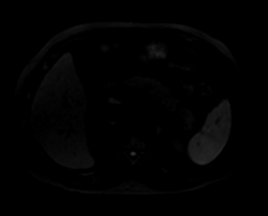
[im 120/120]
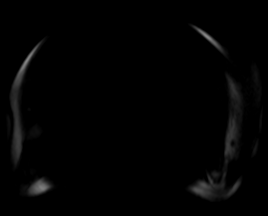

[Series 9: ax dwi_adc · axial · 6.0mm · 1.57mm/px · 1 of 40 slices shown]
[im 1/40]
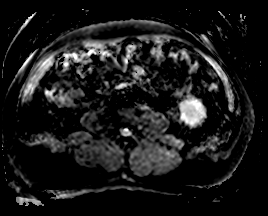

[Series 10: bSSFP · axial · 6.0mm · 0.82mm/px · 1 of 40 slices shown]
[im 1/40]
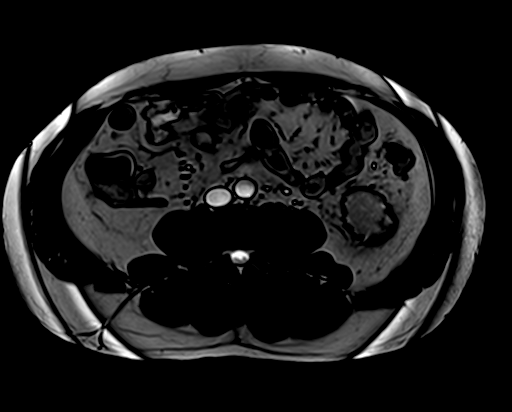

[Series 14: radials · oblique · 50.0mm · 0.78mm/px · 1 of 5 slices shown]
[im 1/5]
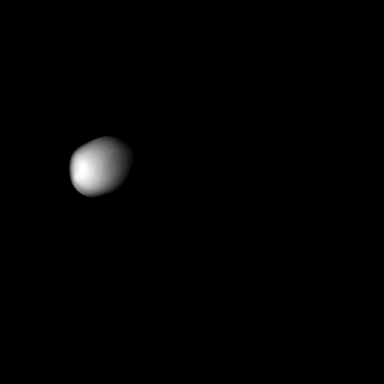

[Series 15: T1 fat-sat · axial · 3.0mm · 1.64mm/px · z∈[-69,+240]mm · 3 of 104 slices shown]
[im 1/104]
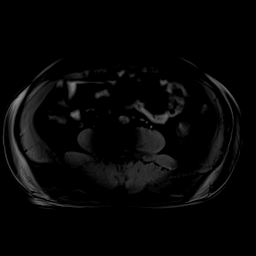
[im 52/104]
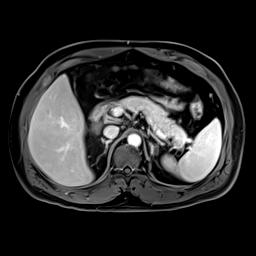
[im 104/104]
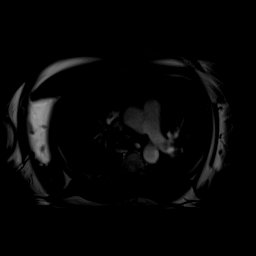

[Series 16: T1 dynamic fat-sat · axial · 3.0mm · 1.64mm/px · z∈[-69,+240]mm · 3 of 104 slices shown (1 of 9)]
[im 1/104]
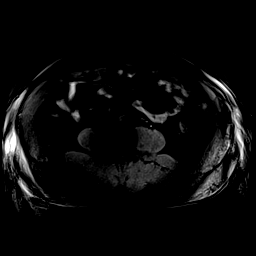
[im 52/104]
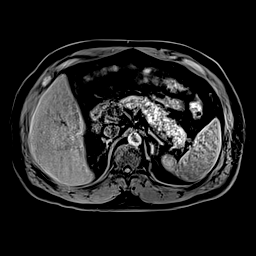
[im 104/104]
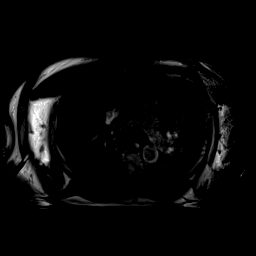

[Series 16: T1 dynamic fat-sat · axial · 3.0mm · 1.64mm/px · z∈[-69,+240]mm · 3 of 104 slices shown (2 of 9)]
[im 1/104]
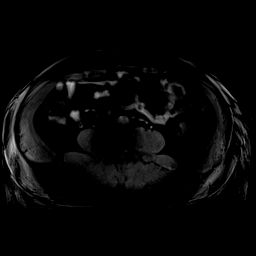
[im 52/104]
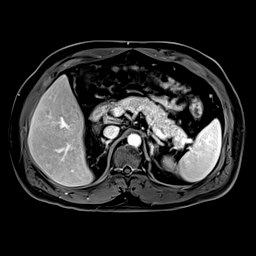
[im 104/104]
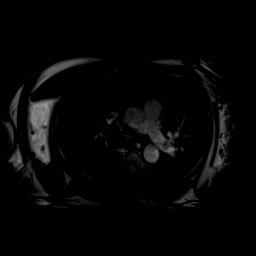

[Series 16: T1 dynamic fat-sat · axial · 3.0mm · 1.64mm/px · z∈[-69,+240]mm · 3 of 104 slices shown (3 of 9)]
[im 1/104]
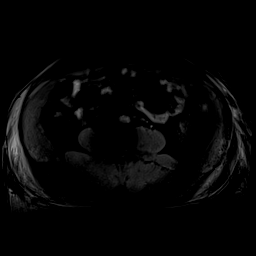
[im 52/104]
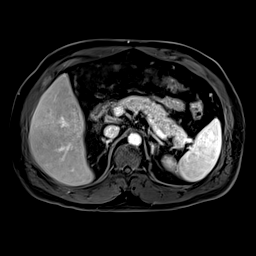
[im 104/104]
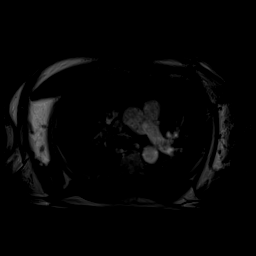

[Series 16: T1 dynamic fat-sat · axial · 3.0mm · 1.64mm/px · z∈[-69,+240]mm · 3 of 104 slices shown (4 of 9)]
[im 1/104]
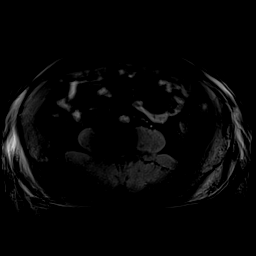
[im 52/104]
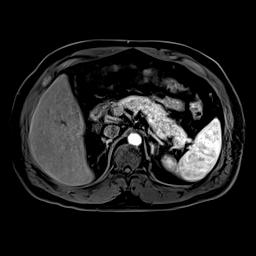
[im 104/104]
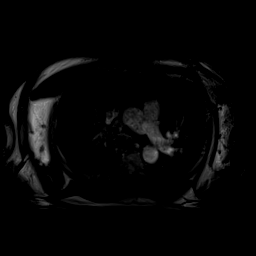

[Series 16: T1 dynamic fat-sat · axial · 3.0mm · 1.64mm/px · z∈[-69,+240]mm · 3 of 104 slices shown (5 of 9)]
[im 1/104]
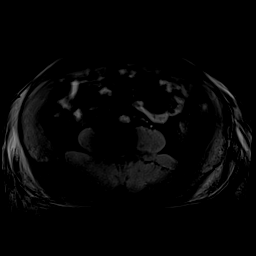
[im 52/104]
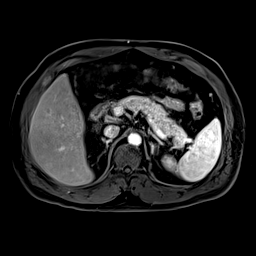
[im 104/104]
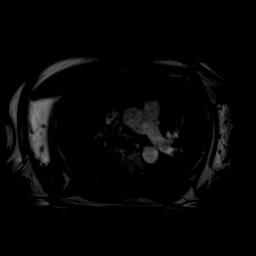

[Series 17: T1 dynamic post-contrast · coronal · 3.0mm · 1.25mm/px · 3 of 88 slices shown]
[im 1/88]
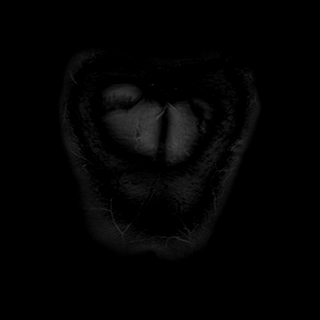
[im 44/88]
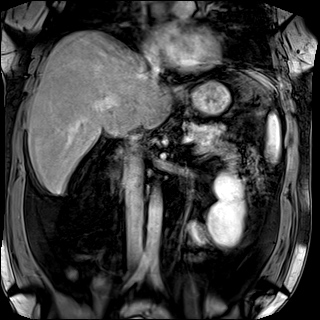
[im 88/88]
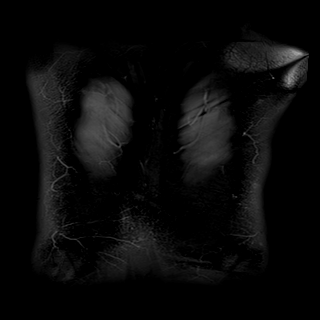

[Series 1027: T1 dynamic fat-sat · axial · 3.0mm · 1.64mm/px · z∈[-69,+240]mm · 3 of 104 slices shown (6 of 9)]
[im 1/104]
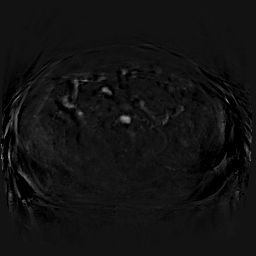
[im 52/104]
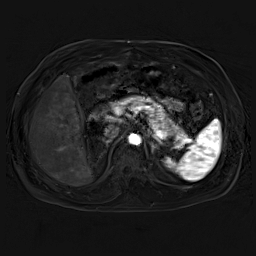
[im 104/104]
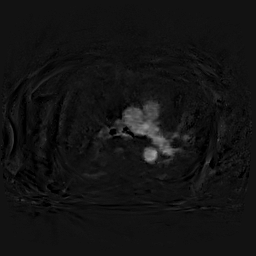

[Series 1027: T1 dynamic fat-sat · axial · 3.0mm · 1.64mm/px · z∈[-69,+240]mm · 3 of 104 slices shown (7 of 9)]
[im 1/104]
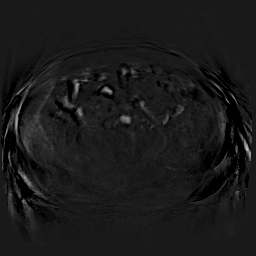
[im 52/104]
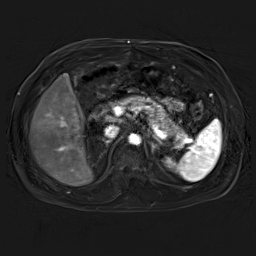
[im 104/104]
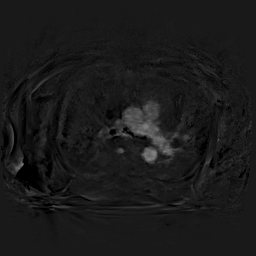

[Series 1027: T1 dynamic fat-sat · axial · 3.0mm · 1.64mm/px · z∈[-69,+240]mm · 3 of 104 slices shown (8 of 9)]
[im 1/104]
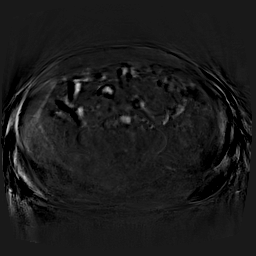
[im 52/104]
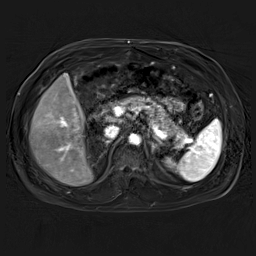
[im 104/104]
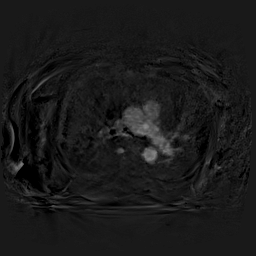

[Series 1027: T1 dynamic fat-sat · axial · 3.0mm · 1.64mm/px · z∈[-69,+240]mm · 3 of 104 slices shown (9 of 9)]
[im 1/104]
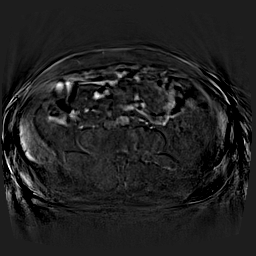
[im 52/104]
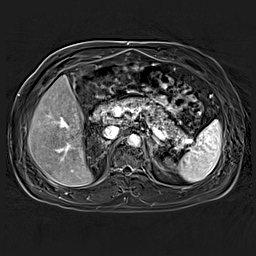
[im 104/104]
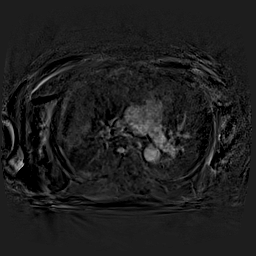

[44 of 48 positions shown; findings below may reference images not displayed]

FINDINGS: Lower chest: No acute pleural or parenchymal lung disease.

Hepatobiliary: Mild hepatomegaly, with liver measuring 21.4 cm in
craniocaudal length. No focal liver abnormalities. No intrahepatic
duct dilation.

Multiple gallstones are identified dependently within the
gallbladder neck. No evidence of acute cholecystitis. No gallbladder
wall thickening.

MRCP images demonstrate normal caliber of the common bile duct
measuring up to 4 mm. No filling defects or choledocholithiasis.

Pancreas: Mild peripancreatic edema surrounding the head and
uncinate process consistent with given history of acute
pancreatitis. Normal enhancement of the pancreatic parenchyma. No
fluid collection, pseudocyst, or abscess. No pancreatic duct
dilation.

Spleen:  Within normal limits in size and appearance.

Adrenals/Urinary Tract: Right kidney is atrophic, with a small cyst
off the lower pole. There is compensatory hypertrophy of the left
kidney, without focal abnormality. The adrenals are grossly normal.

Stomach/Bowel: Scattered diverticulosis of the colon. No bowel
obstruction or ileus. No bowel wall thickening or inflammatory
change.

Vascular/Lymphatic: No pathologically enlarged lymph nodes
identified. No abdominal aortic aneurysm demonstrated.

Other: Trace free fluid surrounding the duodenal sweep. No other
free fluid or free gas. No abdominal wall hernia.

Musculoskeletal: No suspicious bone lesions identified.
IMPRESSION: 1. Cholelithiasis.  No evidence of acute cholecystitis.
2. No evidence of choledocholithiasis. Normal appearance of the
biliary tree.
3. Mild peripancreatic edema surrounding the head and uncinate
process, consistent with acute uncomplicated pancreatitis. No fluid
collection, pseudocyst, or abscess.
4. Mild hepatomegaly.
5. Marked right renal cortical atrophy. Compensatory hypertrophy
left kidney.
6. Colonic diverticulosis without diverticulitis.

## 2020-07-10 IMAGING — MR MR 3D RECON AT SCANNER
2 series · 12 of 16 positions shown · IV contrast (gadavist)
Comparison: [DATE]

CLINICAL DATA: Cholelithiasis, pancreatitis

EXAM:
MRI ABDOMEN WITHOUT AND WITH CONTRAST (INCLUDING MRCP)
TECHNIQUE: Multiplanar multisequence MR imaging of the abdomen was performed
both before and after the administration of intravenous contrast.
Heavily T2-weighted images of the biliary and pancreatic ducts were
obtained, and three-dimensional MRCP images were rendered by post
processing.
CONTRAST:  7.5mL GADAVIST GADOBUTROL 1 MMOL/ML IV SOLN

[Series 12: MRCP · coronal · 1.0mm · 0.55mm/px · 10 of 104 slices shown (1 of 2)]
[im 1/104]
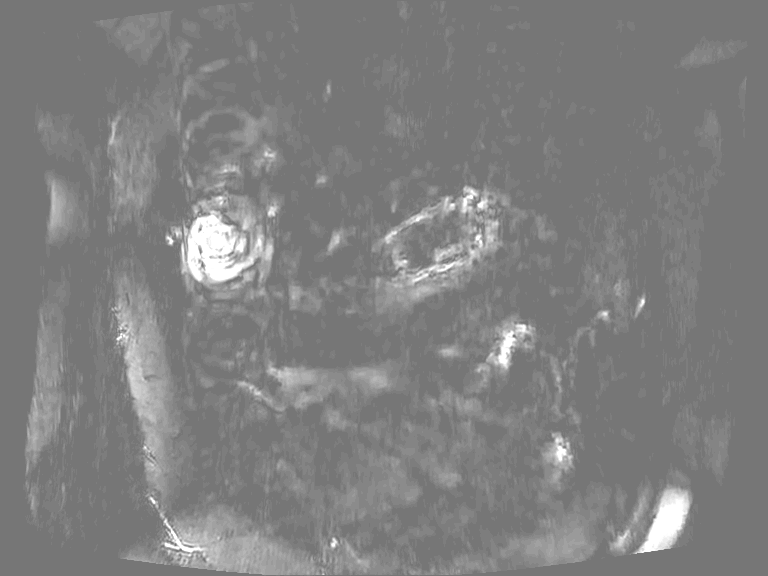
[im 16/104]
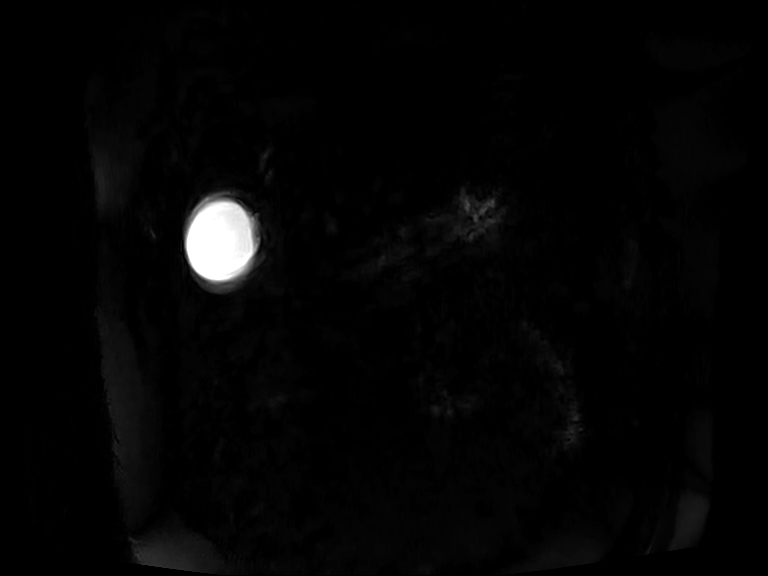
[im 24/104]
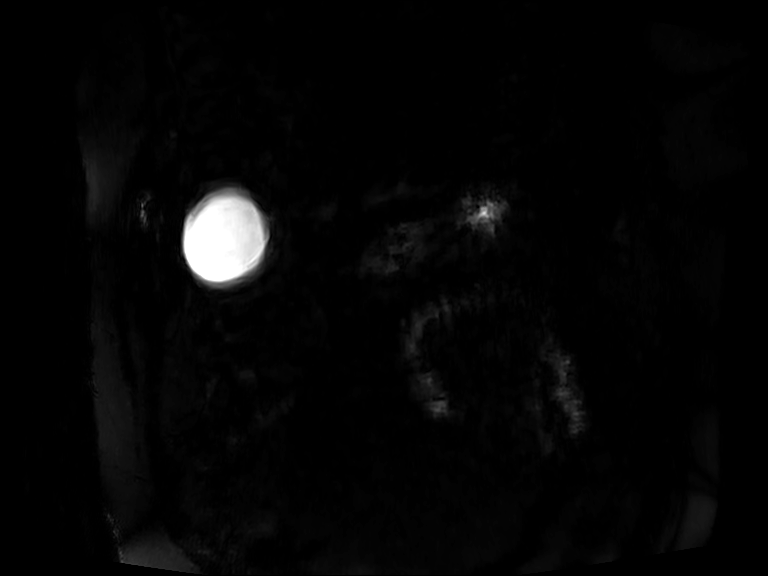
[im 32/104]
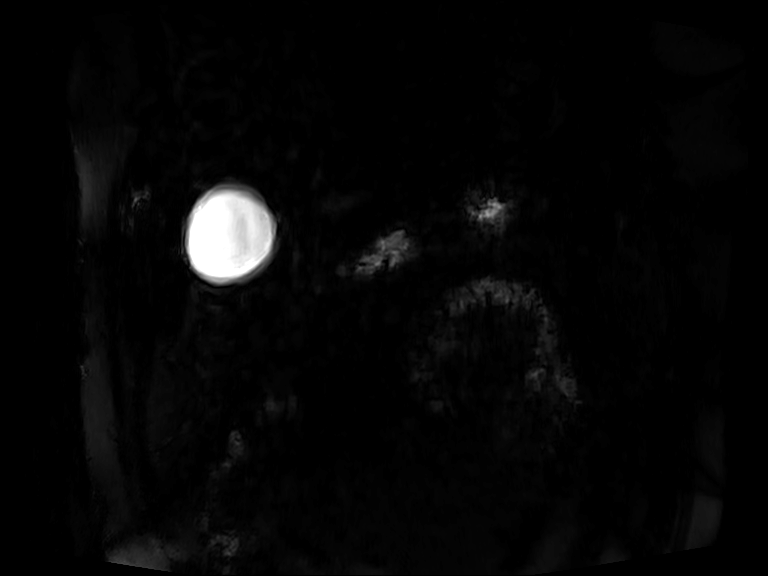
[im 48/104]
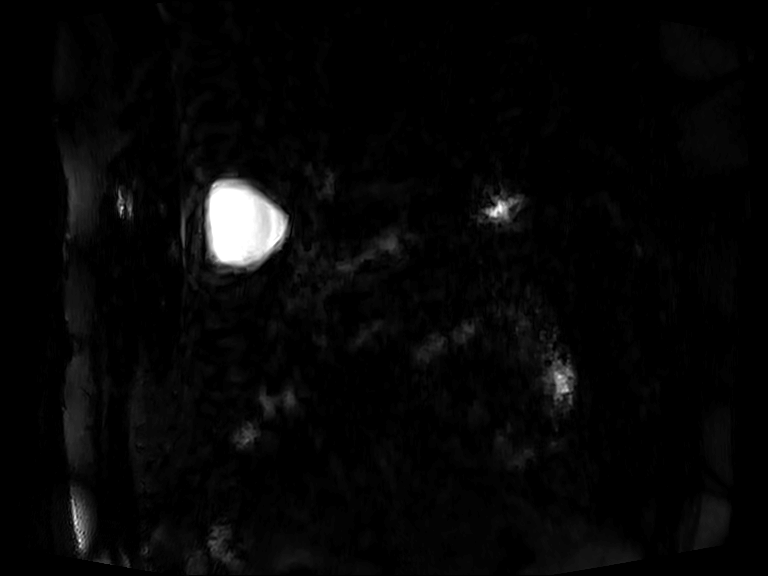
[im 56/104]
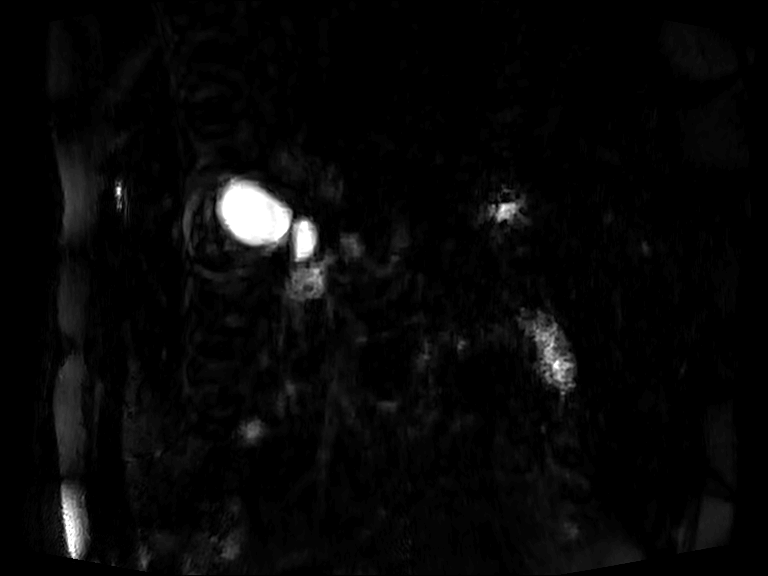
[im 64/104]
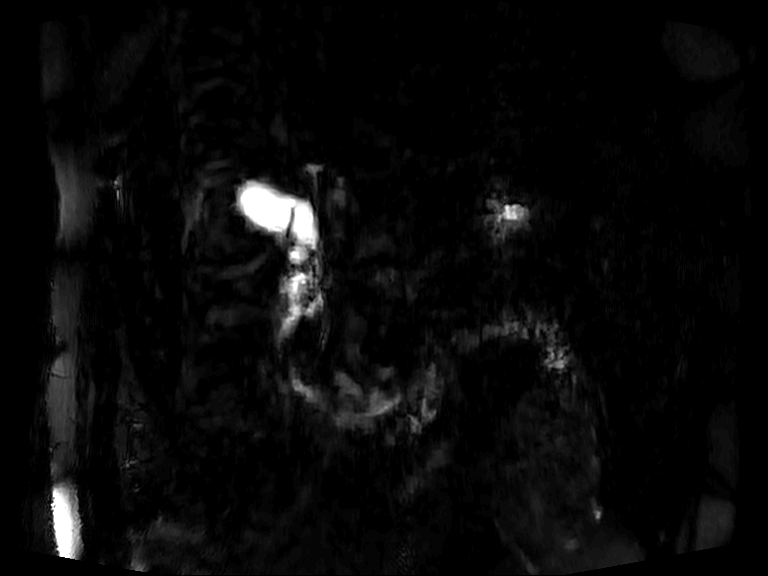
[im 80/104]
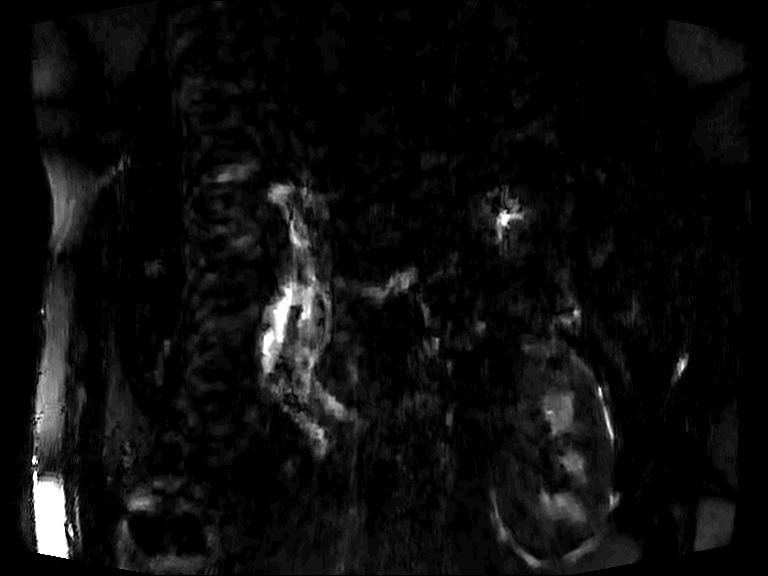
[im 88/104]
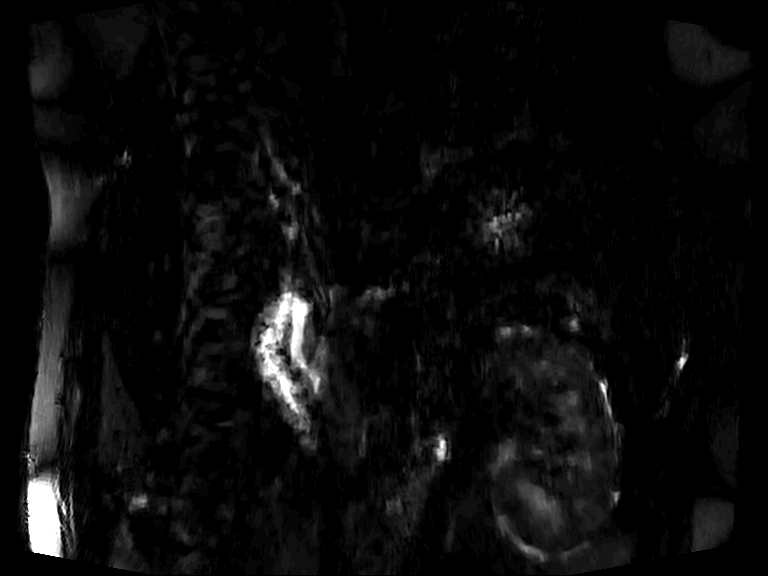
[im 96/104]
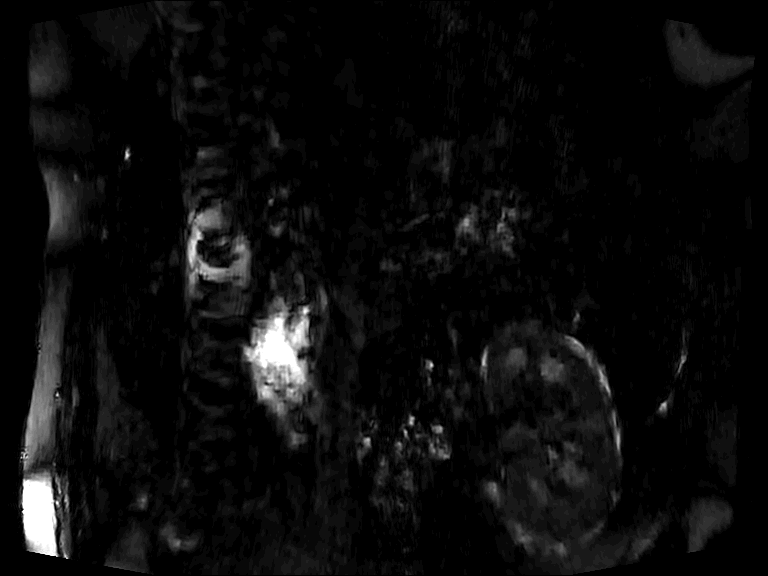

[Series 1021: MRCP · sagittal · 0.5mm · 0.44mm/px · 2 of 19 slices shown (2 of 2)]
[im 1/19]
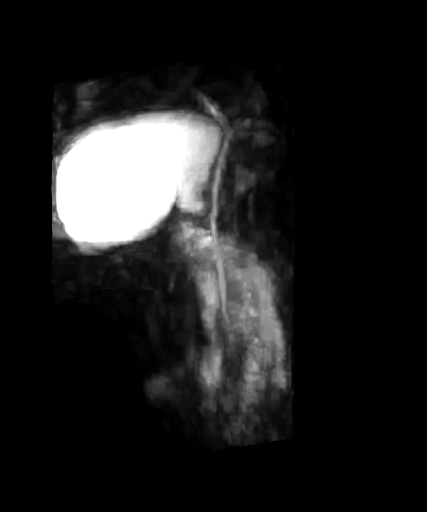
[im 19/19]
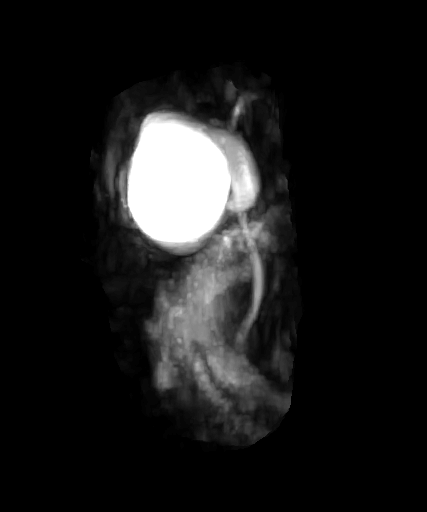

[12 of 16 positions shown; findings below may reference images not displayed]

FINDINGS: Lower chest: No acute pleural or parenchymal lung disease.

Hepatobiliary: Mild hepatomegaly, with liver measuring 21.4 cm in
craniocaudal length. No focal liver abnormalities. No intrahepatic
duct dilation.

Multiple gallstones are identified dependently within the
gallbladder neck. No evidence of acute cholecystitis. No gallbladder
wall thickening.

MRCP images demonstrate normal caliber of the common bile duct
measuring up to 4 mm. No filling defects or choledocholithiasis.

Pancreas: Mild peripancreatic edema surrounding the head and
uncinate process consistent with given history of acute
pancreatitis. Normal enhancement of the pancreatic parenchyma. No
fluid collection, pseudocyst, or abscess. No pancreatic duct
dilation.

Spleen:  Within normal limits in size and appearance.

Adrenals/Urinary Tract: Right kidney is atrophic, with a small cyst
off the lower pole. There is compensatory hypertrophy of the left
kidney, without focal abnormality. The adrenals are grossly normal.

Stomach/Bowel: Scattered diverticulosis of the colon. No bowel
obstruction or ileus. No bowel wall thickening or inflammatory
change.

Vascular/Lymphatic: No pathologically enlarged lymph nodes
identified. No abdominal aortic aneurysm demonstrated.

Other: Trace free fluid surrounding the duodenal sweep. No other
free fluid or free gas. No abdominal wall hernia.

Musculoskeletal: No suspicious bone lesions identified.
IMPRESSION: 1. Cholelithiasis.  No evidence of acute cholecystitis.
2. No evidence of choledocholithiasis. Normal appearance of the
biliary tree.
3. Mild peripancreatic edema surrounding the head and uncinate
process, consistent with acute uncomplicated pancreatitis. No fluid
collection, pseudocyst, or abscess.
4. Mild hepatomegaly.
5. Marked right renal cortical atrophy. Compensatory hypertrophy
left kidney.
6. Colonic diverticulosis without diverticulitis.

## 2020-07-10 MED ORDER — MORPHINE SULFATE (PF) 2 MG/ML IV SOLN: 2.0000 mg | INTRAVENOUS | Status: DC | PRN

## 2020-07-10 MED ORDER — ACETAMINOPHEN 650 MG RE SUPP
650.0000 mg | Freq: Four times a day (QID) | RECTAL | Status: DC | PRN
Start: 2020-07-10 — End: 2020-07-12

## 2020-07-10 MED ORDER — LORAZEPAM 2 MG/ML IJ SOLN: 1.0000 mg | INTRAMUSCULAR | Status: DC | PRN

## 2020-07-10 MED ORDER — THIAMINE HCL 100 MG PO TABS: 100.0000 mg | ORAL_TABLET | Freq: Every day | ORAL | Status: DC

## 2020-07-10 MED ORDER — ONDANSETRON HCL 4 MG PO TABS: 4.0000 mg | ORAL_TABLET | Freq: Four times a day (QID) | ORAL | Status: DC | PRN

## 2020-07-10 MED ORDER — ADULT MULTIVITAMIN W/MINERALS CH: 1.0000 | ORAL_TABLET | Freq: Every day | ORAL | Status: DC

## 2020-07-10 MED ORDER — PHENOL 1.4 % MT LIQD
1.0000 | OROMUCOSAL | Status: DC | PRN
  Administered 2020-07-10: 18:00:00 1 via OROMUCOSAL
  Filled 2020-07-10: qty 177

## 2020-07-10 MED ORDER — FOLIC ACID 1 MG PO TABS: 1.0000 mg | ORAL_TABLET | Freq: Every day | ORAL | Status: DC

## 2020-07-10 MED ORDER — ACETAMINOPHEN 325 MG PO TABS: 650.0000 mg | ORAL_TABLET | Freq: Four times a day (QID) | ORAL | Status: DC | PRN

## 2020-07-10 MED ORDER — HYDROCODONE-ACETAMINOPHEN 5-325 MG PO TABS: 1.0000 | ORAL_TABLET | ORAL | Status: DC | PRN

## 2020-07-10 MED ORDER — IRBESARTAN 150 MG PO TABS
150.0000 mg | ORAL_TABLET | Freq: Every day | ORAL | Status: DC
  Administered 2020-07-10 – 2020-07-12 (×3): 150 mg via ORAL
  Filled 2020-07-10 (×3): qty 1

## 2020-07-10 MED ORDER — GADOBUTROL 1 MMOL/ML IV SOLN
7.0000 mL | Freq: Once | INTRAVENOUS | Status: AC | PRN
  Administered 2020-07-10: 7.5 mL via INTRAVENOUS

## 2020-07-10 MED ORDER — ONDANSETRON HCL 4 MG/2ML IJ SOLN: 4.0000 mg | Freq: Four times a day (QID) | INTRAMUSCULAR | Status: DC | PRN

## 2020-07-10 MED ORDER — LACTATED RINGERS IV BOLUS
1000.0000 mL | Freq: Once | INTRAVENOUS | Status: AC
  Administered 2020-07-10: 1000 mL via INTRAVENOUS

## 2020-07-10 MED ORDER — ENOXAPARIN SODIUM 40 MG/0.4ML IJ SOSY
40.0000 mg | PREFILLED_SYRINGE | INTRAMUSCULAR | Status: DC
  Administered 2020-07-10 – 2020-07-12 (×3): 40 mg via SUBCUTANEOUS
  Filled 2020-07-10 (×3): qty 0.4

## 2020-07-10 MED ORDER — ENOXAPARIN SODIUM 40 MG/0.4ML IJ SOSY: 40.0000 mg | PREFILLED_SYRINGE | INTRAMUSCULAR | Status: DC

## 2020-07-10 MED ORDER — THIAMINE HCL 100 MG/ML IJ SOLN
Freq: Once | INTRAVENOUS | Status: AC
  Filled 2020-07-10: qty 1000

## 2020-07-10 MED ORDER — LORAZEPAM 1 MG PO TABS: 1.0000 mg | ORAL_TABLET | ORAL | Status: DC | PRN

## 2020-07-10 MED ORDER — LORAZEPAM 2 MG/ML IJ SOLN: 0.0000 mg | Freq: Four times a day (QID) | INTRAMUSCULAR | Status: DC

## 2020-07-10 MED ORDER — THIAMINE HCL 100 MG/ML IJ SOLN: 100.0000 mg | Freq: Every day | INTRAMUSCULAR | Status: DC

## 2020-07-10 MED ORDER — LORAZEPAM 2 MG/ML IJ SOLN: 0.0000 mg | Freq: Two times a day (BID) | INTRAMUSCULAR | Status: DC

## 2020-07-10 NOTE — H&P (Signed)
History and Physical    Joshua Yoder UJW:[id number] DOB: 11/12/1980 DOA: 07/09/2020  PCP: Pcp, No   Patient coming from: Home  I have personally briefly reviewed patient's old medical records in [facility]  Chief Complaint: Abdominal pain x3 days  HPI: Joshua Yoder is a 40 y.o. male with medical history significant for hypertension who presented to the ER for evaluation of abdominal pain mostly in the mid abdomen associated with nausea and multiple episodes of vomiting.  He rated his pain a 6 x 10 in intensity at its worst associated with nausea, anorexia and vomiting.  Pain is nonradiating. In addition to abdominal pain he complains of feeling very shaky and fatigued. Patient admits to heavy alcohol use in the past and had initially quit but started back again.  His last drink was a week ago.  He also complains of a frontal headache. He admits to having symptoms of alcohol withdrawal in the past which included tremors but denied having any seizures related to alcohol withdrawal, denies having any fever or chills, no chest pain, no shortness of breath, no cough, no dizziness, no lightheadedness, no changes in his bowel habits, no urinary symptoms, no blurred vision no focal deficit. Labs show sodium 132, potassium 3.8, chloride 97, bicarb 20, glucose 130, BUN 15, creatinine 1.4, calcium 9.3, phosphorus 4.6, magnesium 1.8, alkaline phosphatase 104, albumin 4.5, lipase 115, AST 85, ALT 47, total protein 8.6, lactic acid 2.0, white count 11.8, hemoglobin 17, hematocrit 49, MCV 83.8, RDW 12.4, platelet count 240, lipase 115 CT scan of abdomen and pelvis shows slightly enlarged liver. Possible subtle contour nodularity of the liver as may be seen with cirrhosis. Slightly distended gallbladder containing stones. Stones appear to be within gallbladder neck and possibly cystic duct. Correlation with right upper quadrant ultrasound may be obtained if symptoms are referable to  the gallbladder Atrophic right kidney with stone.  No hydronephrosis. 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 Gallbladder ultrasound shows distended gallbladder with gallstones but no sonographic findings of acute cholecystitis. Heterogeneous liver echogenicity with likely steatosis. MRCP shows cholelithiasis.  No evidence of acute cholecystitis. No evidence of choledocholithiasis. Normal appearance of the biliary tree. Mild peripancreatic edema surrounding the head and uncinate process, consistent with acute uncomplicated pancreatitis. No fluid collection, pseudocyst, or abscess. Mild hepatomegaly. Marked right renal cortical atrophy. Compensatory hypertrophy left kidney. Colonic diverticulosis without diverticulitis.  ED Course: Patient is a 40 year old Hispanic male who presents to the ER for evaluation of abdominal pain mostly periumbilical/mid abdomen, nonradiating associated with nausea and multiple episodes of emesis and anorexia.  He admits to alcohol use and has an elevated lipase level. Imaging confirmed acute uncomplicated pancreatitis. Patient will be admitted to hospital for further evaluation.   Review of Systems: As per HPI otherwise all other systems reviewed and negative.    Past Medical History:  Diagnosis Date  . Hypertension     History reviewed. No pertinent surgical history.   reports that he has never smoked. He has never used smokeless tobacco. He reports previous drug use. No history on file for alcohol use.  No Known Allergies  Family History  Problem Relation Age of Onset  . Hypertension Mother       Prior to Admission medications   Medication Sig Start Date End Date Taking? Authorizing Provider  telmisartan (MICARDIS) 40 MG tablet Take 40 mg by mouth daily.   Yes [provider]    Physical Exam: Vitals:   07/10/20  0200 07/10/20 0400 07/10/20 0612 07/10/20 0827  BP: (!) 132/95  (!) 137/108 (!) 134/104  Pulse:  92 94 97 100  Resp: (!) 33 (!) 23 18 17   Temp:    97.7 F (36.5 C)  TempSrc:    Oral  SpO2: 96% 96% 97% 98%  Weight:      Height:         Vitals:   07/10/20 0200 07/10/20 0400 07/10/20 0612 07/10/20 0827  BP: (!) 132/95  (!) 137/108 (!) 134/104  Pulse: 92 94 97 100  Resp: (!) 33 (!) 23 18 17   Temp:    97.7 F (36.5 C)  TempSrc:    Oral  SpO2: 96% 96% 97% 98%  Weight:      Height:          Constitutional: Alert and oriented x 3 . Not in any apparent distress HEENT:      Head: Normocephalic and atraumatic.         Eyes: PERLA, EOMI, Conjunctivae are normal. Sclera is non-icteric.       Mouth/Throat: Mucous membranes are moist.       Neck: Supple with no signs of meningismus. Cardiovascular: Regular rate and rhythm. No murmurs, gallops, or rubs. 2+ symmetrical distal pulses are present . No JVD. No LE edema Respiratory: Respiratory effort normal .Lungs sounds clear bilaterally. No wheezes, crackles, or rhonchi.  Gastrointestinal: Soft,  Tenderness peri umbilical, and non distended with positive bowel sounds.  Genitourinary: No CVA tenderness. Musculoskeletal: Nontender with normal range of motion in all extremities. No cyanosis, or erythema of extremities. Neurologic:  Face is symmetric. Moving all extremities. No gross focal neurologic deficits . Skin: Skin is warm, dry.  No rash or ulcers Psychiatric: Mood and affect are normal    Labs on Admission: I have personally reviewed following labs and imaging studies  CBC: Recent Labs  Lab 07/09/20 1751  WBC 11.8*  HGB 17.7*  HCT 49.0  MCV 83.8  PLT 240   Basic Metabolic Panel: Recent Labs  Lab 07/09/20 1751 07/10/20 0636  NA 132*  --   K 3.8  --   CL 97*  --   CO2 20*  --   GLUCOSE 130*  --   BUN 15  --   CREATININE 1.41*  --   CALCIUM 9.3  --   MG 1.9 1.8  PHOS 4.6  --    GFR: Estimated Creatinine Clearance: 69.6 mL/min (A) (by C-G formula based on SCr of 1.41 mg/dL (H)). Liver Function Tests: Recent  Labs  Lab 07/09/20 1751  AST 85*  ALT 47*  ALKPHOS 104  BILITOT 2.3*  PROT 8.6*  ALBUMIN 4.5   Recent Labs  Lab 07/09/20 1751  LIPASE 115*   No results for input(s): AMMONIA in the last 168 hours. Coagulation Profile: No results for input(s): INR, PROTIME in the last 168 hours. Cardiac Enzymes: No results for input(s): CKTOTAL, CKMB, CKMBINDEX, TROPONINI in the last 168 hours. BNP (last 3 results) No results for input(s): PROBNP in the last 8760 hours. HbA1C: No results for input(s): HGBA1C in the last 72 hours. CBG: Recent Labs  Lab 07/10/20 0829  GLUCAP 124*   Lipid Profile: No results for input(s): CHOL, HDL, LDLCALC, TRIG, CHOLHDL, LDLDIRECT in the last 72 hours. Thyroid Function Tests: No results for input(s): TSH, T4TOTAL, FREET4, T3FREE, THYROIDAB in the last 72 hours. Anemia Panel: No results for input(s): VITAMINB12, FOLATE, FERRITIN, TIBC, IRON, RETICCTPCT in the last 72 hours. Urine analysis:  No results found for: COLORURINE, APPEARANCEUR, LABSPEC, PHURINE, GLUCOSEU, HGBUR, BILIRUBINUR, KETONESUR, PROTEINUR, UROBILINOGEN, NITRITE, LEUKOCYTESUR  Radiological Exams on Admission: CT ABDOMEN PELVIS WO CONTRAST  Result Date: 07/09/2020 CLINICAL DATA:  Vomiting right-sided abdominal pain EXAM: CT ABDOMEN AND PELVIS WITHOUT CONTRAST TECHNIQUE: Multidetector CT imaging of the abdomen and pelvis was performed following the standard protocol without IV contrast. COMPARISON:  None. FINDINGS: Lower chest: Lung bases demonstrate no acute consolidation or effusion. Mild elevation of right diaphragm. Hepatobiliary: Liver slightly enlarged at 21 cm. Possible subtle contour nodularity. Gallbladder slightly dilated. Small stones within the gallbladder neck and possibly the cystic duct. No right upper quadrant inflammatory change. Pancreas: Unremarkable. No pancreatic ductal dilatation or surrounding inflammatory changes. Spleen: Normal in size without focal abnormality.  Adrenals/Urinary Tract: Adrenal glands are normal. Atrophic right kidney with punctate stone at the upper pole. Exophytic low-attenuation lesion off the lower pole probably a cyst. No hydronephrosis. The bladder is normal Stomach/Bowel: Stomach is within normal limits. Appendix appears normal. No evidence of bowel wall thickening, distention, or inflammatory changes. Diverticular disease of the colon without acute wall thickening. Vascular/Lymphatic: Mild aortic atherosclerosis. No aneurysm. No suspicious nodes Reproductive: Prostate is unremarkable. Other: Negative for free air or free fluid. Left fat containing inguinal hernia Musculoskeletal: No acute or significant osseous findings. IMPRESSION: 1. Slightly enlarged liver. Possible subtle contour nodularity of the liver as may be seen with cirrhosis. 2. Slightly distended gallbladder containing stones. Stones appear to be within gallbladder neck and possibly cystic duct. Correlation with right upper quadrant ultrasound may be obtained if symptoms are referable to the gallbladder 3. Atrophic right kidney with stone.  No hydronephrosis 4. 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   On: 07/09/2020 22:21   MR 3D Recon At Scanner  Result Date: 07/10/2020 CLINICAL DATA:  Cholelithiasis, pancreatitis EXAM: MRI ABDOMEN WITHOUT AND WITH CONTRAST (INCLUDING MRCP) TECHNIQUE: Multiplanar multisequence MR imaging of the abdomen was performed both before and after the administration of intravenous contrast. Heavily T2-weighted images of the biliary and pancreatic ducts were obtained, and three-dimensional MRCP images were rendered by post processing. CONTRAST:  7.72mL GADAVIST GADOBUTROL 1 MMOL/ML IV SOLN COMPARISON:  07/09/2020 FINDINGS: Lower chest: No acute pleural or parenchymal lung disease. Hepatobiliary: Mild hepatomegaly, with liver measuring 21.4 cm in craniocaudal length. No focal liver  abnormalities. No intrahepatic duct dilation. Multiple gallstones are identified dependently within the gallbladder neck. No evidence of acute cholecystitis. No gallbladder wall thickening. MRCP images demonstrate normal caliber of the common bile duct measuring up to 4 mm. No filling defects or choledocholithiasis. Pancreas: Mild peripancreatic edema surrounding the head and uncinate process consistent with given history of acute pancreatitis. Normal enhancement of the pancreatic parenchyma. No fluid collection, pseudocyst, or abscess. No pancreatic duct dilation. Spleen:  Within normal limits in size and appearance. Adrenals/Urinary Tract: Right kidney is atrophic, with a small cyst off the lower pole. There is compensatory hypertrophy of the left kidney, without focal abnormality. The adrenals are grossly normal. Stomach/Bowel: Scattered diverticulosis of the colon. No bowel obstruction or ileus. No bowel wall thickening or inflammatory change. Vascular/Lymphatic: No pathologically enlarged lymph nodes identified. No abdominal aortic aneurysm demonstrated. Other: Trace free fluid surrounding the duodenal sweep. No other free fluid or free gas. No abdominal wall hernia. Musculoskeletal: No suspicious bone lesions identified. IMPRESSION: 1. Cholelithiasis.  No evidence of acute cholecystitis. 2. No evidence of choledocholithiasis. Normal appearance of the biliary tree. 3. Mild  peripancreatic edema surrounding the head and uncinate process, consistent with acute uncomplicated pancreatitis. No fluid collection, pseudocyst, or abscess. 4. Mild hepatomegaly. 5. Marked right renal cortical atrophy. Compensatory hypertrophy left kidney. 6. Colonic diverticulosis without diverticulitis. Electronically Signed   By: Sharlet Salina M.D.   On: 07/10/2020 03:55   MR ABDOMEN MRCP W WO CONTAST  Result Date: 07/10/2020 CLINICAL DATA:  Cholelithiasis, pancreatitis EXAM: MRI ABDOMEN WITHOUT AND WITH CONTRAST (INCLUDING MRCP)  TECHNIQUE: Multiplanar multisequence MR imaging of the abdomen was performed both before and after the administration of intravenous contrast. Heavily T2-weighted images of the biliary and pancreatic ducts were obtained, and three-dimensional MRCP images were rendered by post processing. CONTRAST:  7.87mL GADAVIST GADOBUTROL 1 MMOL/ML IV SOLN COMPARISON:  07/09/2020 FINDINGS: Lower chest: No acute pleural or parenchymal lung disease. Hepatobiliary: Mild hepatomegaly, with liver measuring 21.4 cm in craniocaudal length. No focal liver abnormalities. No intrahepatic duct dilation. Multiple gallstones are identified dependently within the gallbladder neck. No evidence of acute cholecystitis. No gallbladder wall thickening. MRCP images demonstrate normal caliber of the common bile duct measuring up to 4 mm. No filling defects or choledocholithiasis. Pancreas: Mild peripancreatic edema surrounding the head and uncinate process consistent with given history of acute pancreatitis. Normal enhancement of the pancreatic parenchyma. No fluid collection, pseudocyst, or abscess. No pancreatic duct dilation. Spleen:  Within normal limits in size and appearance. Adrenals/Urinary Tract: Right kidney is atrophic, with a small cyst off the lower pole. There is compensatory hypertrophy of the left kidney, without focal abnormality. The adrenals are grossly normal. Stomach/Bowel: Scattered diverticulosis of the colon. No bowel obstruction or ileus. No bowel wall thickening or inflammatory change. Vascular/Lymphatic: No pathologically enlarged lymph nodes identified. No abdominal aortic aneurysm demonstrated. Other: Trace free fluid surrounding the duodenal sweep. No other free fluid or free gas. No abdominal wall hernia. Musculoskeletal: No suspicious bone lesions identified. IMPRESSION: 1. Cholelithiasis.  No evidence of acute cholecystitis. 2. No evidence of choledocholithiasis. Normal appearance of the biliary tree. 3. Mild  peripancreatic edema surrounding the head and uncinate process, consistent with acute uncomplicated pancreatitis. No fluid collection, pseudocyst, or abscess. 4. Mild hepatomegaly. 5. Marked right renal cortical atrophy. Compensatory hypertrophy left kidney. 6. Colonic diverticulosis without diverticulitis. Electronically Signed   By: Sharlet Salina M.D.   On: 07/10/2020 03:55   US ABDOMEN LIMITED RUQ (LIVER/GB)  Result Date: 07/09/2020 CLINICAL DATA:  Abdominal pain. EXAM: ULTRASOUND ABDOMEN LIMITED RIGHT UPPER QUADRANT COMPARISON:  CT earlier today. FINDINGS: Gallbladder: Distended with stones at the gallbladder neck. Suspected layering sludge. No wall thickening or pericholecystic fluid. No sonographic Murphy sign noted by sonographer. Common bile duct: Diameter: 3 mm at the porta hepatis, not well visualized distally Liver: Heterogeneous echogenicity with likely steatosis. The right kidney is markedly atrophic for comparison. No evidence of focal lesion. Portal vein is patent on color Doppler imaging with normal direction of blood flow towards the liver. Other: No right upper quadrant ascites. IMPRESSION: 1. Distended gallbladder with gallstones but no sonographic findings of acute cholecystitis. 2. Heterogeneous liver echogenicity with likely steatosis. Electronically Signed   By: Narda Rutherford M.D.   On: 07/09/2020 23:38     Assessment/Plan Principal Problem:   Alcoholic pancreatitis Active Problems:   Cholelithiasis   Essential hypertension   Alcohol dependence (HCC)   Transaminitis     Acute alcoholic pancreatitis Supportive care IV fluid hydration Pain control, antiemetics and IV PPI N.p.o. for now   Hypertension Continue Avapro   Cholelithiasis Asymptomatic at this  time   Alcohol dependence Patient has been advised to abstain from further alcohol use Will place on CIWA protocol and administer lorazepam for CIWA score of 8 or greater   Transaminitis Most likely  related to alcohol dependence/abuse  DVT prophylaxis: Lovenox Code Status: full code Family Communication: Greater than 50% of time was spent discussing patient's condition and plan of care with him at the bedside.  All questions and concerns have been addressed.  He verbalizes understanding and agrees with the plan. Disposition Plan: Back to previous home environment Consults called: none Status: At the time of admission, it appears that the appropriate admission status for this patient is inpatient. This is judged to be reasonable and necessary in order to provide the required intensity of service to ensure the patient's safety given the presenting symptoms, physical exam findings, and initial radiographic and laboratory data in the context of their comorbid conditions. Patient requires inpatient status due to high intensity of service, high risk for further deterioration and high frequency of surveillance required.    Lucile Shuttersochukwu Anibal Quinby MD [facility]     07/10/2020, 8:41 AM

## 2020-07-10 NOTE — ED Notes (Signed)
Patient transported to MRI 

## 2020-07-11 DIAGNOSIS — K852 Alcohol induced acute pancreatitis without necrosis or infection: Principal | ICD-10-CM

## 2020-07-11 DIAGNOSIS — F10230 Alcohol dependence with withdrawal, uncomplicated: Secondary | ICD-10-CM

## 2020-07-11 LAB — BASIC METABOLIC PANEL
Anion gap: 8 (ref 5–15)
BUN: 22 mg/dL — ABNORMAL HIGH (ref 6–20)
CO2: 26 mmol/L (ref 22–32)
Calcium: 9.2 mg/dL (ref 8.9–10.3)
Chloride: 102 mmol/L (ref 98–111)
Creatinine, Ser: 1.21 mg/dL (ref 0.61–1.24)
GFR, Estimated: >60 mL/min (ref >60)
Glucose, Bld: 87 mg/dL (ref 70–99)
Potassium: 3.4 mmol/L — ABNORMAL LOW (ref 3.5–5.1)
Sodium: 136 mmol/L (ref 135–145)

## 2020-07-11 LAB — HEPATIC FUNCTION PANEL
ALT: 29 U/L (ref 0–44)
AST: 43 U/L — ABNORMAL HIGH (ref 15–41)
Albumin: 3.6 g/dL (ref 3.5–5.0)
Alkaline Phosphatase: 83 U/L (ref 38–126)
Bilirubin, Direct: 0.4 mg/dL — ABNORMAL HIGH (ref 0.0–0.2)
Indirect Bilirubin: 1.7 mg/dL — ABNORMAL HIGH (ref 0.3–0.9)
Total Bilirubin: 2.1 mg/dL — ABNORMAL HIGH (ref 0.3–1.2)
Total Protein: 7.4 g/dL (ref 6.5–8.1)

## 2020-07-11 LAB — CBC
HCT: 40.9 % (ref 39.0–52.0)
Hemoglobin: 14.7 g/dL (ref 13.0–17.0)
MCH: 31 pg (ref 26.0–34.0)
MCHC: 35.9 g/dL (ref 30.0–36.0)
MCV: 86.3 fL (ref 80.0–100.0)
Platelets: 130 10*3/uL — ABNORMAL LOW (ref 150–400)
RBC: 4.74 MIL/uL (ref 4.22–5.81)
RDW: 12.5 % (ref 11.5–15.5)
WBC: 7.5 10*3/uL (ref 4.0–10.5)
nRBC: 0 % (ref 0.0–0.2)

## 2020-07-11 LAB — LIPASE, BLOOD
Lipase: 143 U/L — ABNORMAL HIGH (ref 11–51)
Lipase: 123 U/L — ABNORMAL HIGH (ref 11–51)

## 2020-07-11 MED ORDER — SODIUM CHLORIDE 0.9 % IV SOLN: INTRAVENOUS | Status: DC

## 2020-07-11 NOTE — Progress Notes (Signed)
Interpreter Services used, Interpreter [place] ID # [id number]. Full Head to assessment, needed education and development and agreance on plan of care are topics covered although not an all inclusive list. Pt presented no questions at the given time.

## 2020-07-11 NOTE — Clinical Social Work Note (Signed)
CSW acknowledges consult for medication assistance and SA resources. Will follow for discharge needs and provide resources prior to discharge.  Charlynn Court, CSW [phone]

## 2020-07-11 NOTE — Progress Notes (Signed)
Progress Note    Joshua Yoder   QMG:[id number]  DOB: [birth year]  DOA: 07/09/2020     1  PCP: Pcp, No  CC: Abdominal pain  Hospital Course: Joshua Yoder is a 40 y.o. male with medical history significant for hypertension who presented to the ER for evaluation of abdominal pain mostly in the mid abdomen associated with nausea and multiple episodes of vomiting.   He rated his pain a 6 x 10 in intensity at its worst associated with nausea, anorexia and vomiting.    Patient endorsed almost daily alcohol use at home, approximately 3 beers per day.  He states it has been approximately 1 week since his last alcoholic beverage upon admission. He underwent further work-up with CT abdomen/pelvis followed by right upper quadrant ultrasound and then MRCP.  Ultimately he was found to have pancreatitis with mild peripancreatic edema.  Lipase was also elevated of note on admission.  Physical exam also was consistent with pancreatitis. Imaging studies also noted cholelithiasis but no acute cholecystitis or choledocholithiasis. Liver imaging also showed steatosis.   He was admitted for pancreatitis treatment and monitoring for any alcohol withdrawal.  Interval History:  Resting comfortably in bed when seen this morning.  No signs of alcohol withdrawal and mood was pleasant and cooperative.  He also states that his abdominal pain has significantly improved and he has no further nausea/vomiting.  He was amenable with a trial of clear liquid diet and was also anxious for advancing his diet relatively quickly.  Stated that we would try liquids then advance further if able and if still feeling better tomorrow, probable discharge home.  Patient good with this plan.  ROS: Constitutional: negative for chills and fevers, Respiratory: negative for cough, Cardiovascular: negative for chest pressure/discomfort and Gastrointestinal: negative for abdominal pain  Assessment & Plan:  Acute  alcoholic pancreatitis -Elevated lipase, radiographic imaging, and physical exam consistent with pancreatitis - Was made n.p.o. on admission - Trial of clear liquid diet today.  If tolerates will advance further later this evening - Continue fluids, nausea, and pain control - If feeling better tomorrow still, will probably discharge home  Alcohol use - Last drink approximately 1 week prior to admission - No signs of withdrawal currently - Continue CIWA protocol for now  Hypertension Continue Avapro  Cholelithiasis Asymptomatic at this time  Transaminitis Most likely related to alcohol dependence/abuse -Underlying hepatic steatosis noted on imaging - Labs mildly consistent with alcoholic hepatitis.  Check PT/INR  Old records reviewed in assessment of this patient  Antimicrobials:   DVT prophylaxis: enoxaparin (LOVENOX) injection 40 mg Start: 07/10/20 1000   Code Status:   Code Status: Full Code Family Communication:   Disposition Plan: Status is: Inpatient  Remains inpatient appropriate because:IV treatments appropriate due to intensity of illness or inability to take PO and Inpatient level of care appropriate due to severity of illness   Dispo: The patient is from: Home              Anticipated d/c is to: Home              Patient currently is not medically stable to d/c.   Difficult to place patient No      Risk of unplanned readmission score: Unplanned Admission- Pilot do not use: 11.2   Objective: Blood pressure 128/87, pulse 81, temperature 97.8 F (36.6 C), temperature source Oral, resp. rate 18, height 5\' 7"  (1.702 m), weight 79.4 kg, SpO2 95 %.  Examination: General  appearance: alert, cooperative and no distress Head: Normocephalic, without obvious abnormality, atraumatic Eyes: EOMI Lungs: clear to auscultation bilaterally Heart: regular rate and rhythm and S1, S2 normal Abdomen: Mildly obese, soft, nontender, nondistended, bowel sounds  present Extremities: No edema Skin: mobility and turgor normal Neurologic: No focal deficits.  No tremor appreciate  Consultants:     Procedures:     Data Reviewed: I have personally reviewed following labs and imaging studies Results for orders placed or performed during the hospital encounter of 07/09/20 (from the past 24 hour(s))  Glucose, capillary     Status: Abnormal   Collection Time: 07/10/20  1:57 PM  Result Value Ref Range   Glucose-Capillary 114 (H) 70 - 99 mg/dL  Basic metabolic panel     Status: Abnormal   Collection Time: 07/11/20  4:56 AM  Result Value Ref Range   Sodium 136 135 - 145 mmol/L   Potassium 3.4 (L) 3.5 - 5.1 mmol/L   Chloride 102 98 - 111 mmol/L   CO2 26 22 - 32 mmol/L   Glucose, Bld 87 70 - 99 mg/dL   BUN 22 (H) 6 - 20 mg/dL   Creatinine, Ser 1.93 0.61 - 1.24 mg/dL   Calcium 9.2 8.9 - 79.0 mg/dL   GFR, Estimated >24 >09 mL/min   Anion gap 8 5 - 15  CBC     Status: Abnormal   Collection Time: 07/11/20  4:56 AM  Result Value Ref Range   WBC 7.5 4.0 - 10.5 K/uL   RBC 4.74 4.22 - 5.81 MIL/uL   Hemoglobin 14.7 13.0 - 17.0 g/dL   HCT 73.5 32.9 - 92.4 %   MCV 86.3 80.0 - 100.0 fL   MCH 31.0 26.0 - 34.0 pg   MCHC 35.9 30.0 - 36.0 g/dL   RDW 26.8 34.1 - 96.2 %   Platelets 130 (L) 150 - 400 K/uL   nRBC 0.0 0.0 - 0.2 %  Hepatic function panel     Status: Abnormal   Collection Time: 07/11/20  4:56 AM  Result Value Ref Range   Total Protein 7.4 6.5 - 8.1 g/dL   Albumin 3.6 3.5 - 5.0 g/dL   AST 43 (H) 15 - 41 U/L   ALT 29 0 - 44 U/L   Alkaline Phosphatase 83 38 - 126 U/L   Total Bilirubin 2.1 (H) 0.3 - 1.2 mg/dL   Bilirubin, Direct 0.4 (H) 0.0 - 0.2 mg/dL   Indirect Bilirubin 1.7 (H) 0.3 - 0.9 mg/dL  Lipase, blood     Status: Abnormal   Collection Time: 07/11/20  4:56 AM  Result Value Ref Range   Lipase 143 (H) 11 - 51 U/L    Recent Results (from the past 240 hour(s))  Resp Panel by RT-PCR (Flu A&B, Covid) Nasopharyngeal Swab     Status:  None   Collection Time: 07/09/20 11:34 PM   Specimen: Nasopharyngeal Swab; Nasopharyngeal(NP) swabs in vial transport medium  Result Value Ref Range Status   SARS Coronavirus 2 by RT PCR NEGATIVE NEGATIVE Final    Comment: (NOTE) SARS-CoV-2 target nucleic acids are NOT DETECTED.  The SARS-CoV-2 RNA is generally detectable in upper respiratory specimens during the acute phase of infection. The lowest concentration of SARS-CoV-2 viral copies this assay can detect is 138 copies/mL. A negative result does not preclude SARS-Cov-2 infection and should not be used as the sole basis for treatment or other patient management decisions. A negative result may occur with  improper specimen collection/handling, submission of  specimen other than nasopharyngeal swab, presence of viral mutation(s) within the areas targeted by this assay, and inadequate number of viral copies(<138 copies/mL). A negative result must be combined with clinical observations, patient history, and epidemiological information. The expected result is Negative.  Fact Sheet for Patients:  BloggerCourse.comhttps://www.fda.gov/media/152166/download  Fact Sheet for Healthcare Providers:  SeriousBroker.ithttps://www.fda.gov/media/152162/download  This test is no t yet approved or cleared by the [place] FDA and  has been authorized for detection and/or diagnosis of SARS-CoV-2 by FDA under an Emergency Use Authorization (EUA). This EUA will remain  in effect (meaning this test can be used) for the duration of the COVID-19 declaration under Section 564(b)(1) of the Act, 21 U.S.C.section 360bbb-3(b)(1), unless the authorization is terminated  or revoked sooner.       Influenza A by PCR NEGATIVE NEGATIVE Final   Influenza B by PCR NEGATIVE NEGATIVE Final    Comment: (NOTE) The Xpert Xpress SARS-CoV-2/FLU/RSV plus assay is intended as an aid in the diagnosis of influenza from Nasopharyngeal swab specimens and should not be used as a sole basis for  treatment. Nasal washings and aspirates are unacceptable for Xpert Xpress SARS-CoV-2/FLU/RSV testing.  Fact Sheet for Patients: BloggerCourse.comhttps://www.fda.gov/media/152166/download  Fact Sheet for Healthcare Providers: SeriousBroker.ithttps://www.fda.gov/media/152162/download  This test is not yet approved or cleared by the [place] FDA and has been authorized for detection and/or diagnosis of SARS-CoV-2 by FDA under an Emergency Use Authorization (EUA). This EUA will remain in effect (meaning this test can be used) for the duration of the COVID-19 declaration under Section 564(b)(1) of the Act, 21 U.S.C. section 360bbb-3(b)(1), unless the authorization is terminated or revoked.  Performed at [facility], [address] [place], [place] [id number]      Radiology Studies: CT ABDOMEN PELVIS WO CONTRAST  Result Date: 07/09/2020 CLINICAL DATA:  Vomiting right-sided abdominal pain EXAM: CT ABDOMEN AND PELVIS WITHOUT CONTRAST TECHNIQUE: Multidetector CT imaging of the abdomen and pelvis was performed following the standard protocol without IV contrast. COMPARISON:  None. FINDINGS: Lower chest: Lung bases demonstrate no acute consolidation or effusion. Mild elevation of right diaphragm. Hepatobiliary: Liver slightly enlarged at 21 cm. Possible subtle contour nodularity. Gallbladder slightly dilated. Small stones within the gallbladder neck and possibly the cystic duct. No right upper quadrant inflammatory change. Pancreas: Unremarkable. No pancreatic ductal dilatation or surrounding inflammatory changes. Spleen: Normal in size without focal abnormality. Adrenals/Urinary Tract: Adrenal glands are normal. Atrophic right kidney with punctate stone at the upper pole. Exophytic low-attenuation lesion off the lower pole probably a cyst. No hydronephrosis. The bladder is normal Stomach/Bowel: Stomach is within normal limits. Appendix appears normal. No evidence of bowel wall thickening, distention, or inflammatory  changes. Diverticular disease of the colon without acute wall thickening. Vascular/Lymphatic: Mild aortic atherosclerosis. No aneurysm. No suspicious nodes Reproductive: Prostate is unremarkable. Other: Negative for free air or free fluid. Left fat containing inguinal hernia Musculoskeletal: No acute or significant osseous findings. IMPRESSION: 1. Slightly enlarged liver. Possible subtle contour nodularity of the liver as may be seen with cirrhosis. 2. Slightly distended gallbladder containing stones. Stones appear to be within gallbladder neck and possibly cystic duct. Correlation with right upper quadrant ultrasound may be obtained if symptoms are referable to the gallbladder 3. Atrophic right kidney with stone.  No hydronephrosis 4. 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   On: 07/09/2020 22:21   MR 3D Recon At Scanner  Result Date: 07/10/2020 CLINICAL DATA:  Cholelithiasis, pancreatitis EXAM: MRI ABDOMEN WITHOUT AND WITH CONTRAST (INCLUDING MRCP) TECHNIQUE: Multiplanar multisequence MR imaging of the abdomen was performed both before and after the administration of intravenous contrast. Heavily T2-weighted images of the biliary and pancreatic ducts were obtained, and three-dimensional MRCP images were rendered by post processing. CONTRAST:  7.14mL GADAVIST GADOBUTROL 1 MMOL/ML IV SOLN COMPARISON:  07/09/2020 FINDINGS: Lower chest: No acute pleural or parenchymal lung disease. Hepatobiliary: Mild hepatomegaly, with liver measuring 21.4 cm in craniocaudal length. No focal liver abnormalities. No intrahepatic duct dilation. Multiple gallstones are identified dependently within the gallbladder neck. No evidence of acute cholecystitis. No gallbladder wall thickening. MRCP images demonstrate normal caliber of the common bile duct measuring up to 4 mm. No filling defects or choledocholithiasis. Pancreas: Mild peripancreatic edema surrounding  the head and uncinate process consistent with given history of acute pancreatitis. Normal enhancement of the pancreatic parenchyma. No fluid collection, pseudocyst, or abscess. No pancreatic duct dilation. Spleen:  Within normal limits in size and appearance. Adrenals/Urinary Tract: Right kidney is atrophic, with a small cyst off the lower pole. There is compensatory hypertrophy of the left kidney, without focal abnormality. The adrenals are grossly normal. Stomach/Bowel: Scattered diverticulosis of the colon. No bowel obstruction or ileus. No bowel wall thickening or inflammatory change. Vascular/Lymphatic: No pathologically enlarged lymph nodes identified. No abdominal aortic aneurysm demonstrated. Other: Trace free fluid surrounding the duodenal sweep. No other free fluid or free gas. No abdominal wall hernia. Musculoskeletal: No suspicious bone lesions identified. IMPRESSION: 1. Cholelithiasis.  No evidence of acute cholecystitis. 2. No evidence of choledocholithiasis. Normal appearance of the biliary tree. 3. Mild peripancreatic edema surrounding the head and uncinate process, consistent with acute uncomplicated pancreatitis. No fluid collection, pseudocyst, or abscess. 4. Mild hepatomegaly. 5. Marked right renal cortical atrophy. Compensatory hypertrophy left kidney. 6. Colonic diverticulosis without diverticulitis. Electronically Signed   By: Sharlet Salina M.D.   On: 07/10/2020 03:55   MR ABDOMEN MRCP W WO CONTAST  Result Date: 07/10/2020 CLINICAL DATA:  Cholelithiasis, pancreatitis EXAM: MRI ABDOMEN WITHOUT AND WITH CONTRAST (INCLUDING MRCP) TECHNIQUE: Multiplanar multisequence MR imaging of the abdomen was performed both before and after the administration of intravenous contrast. Heavily T2-weighted images of the biliary and pancreatic ducts were obtained, and three-dimensional MRCP images were rendered by post processing. CONTRAST:  7.9mL GADAVIST GADOBUTROL 1 MMOL/ML IV SOLN COMPARISON:  07/09/2020  FINDINGS: Lower chest: No acute pleural or parenchymal lung disease. Hepatobiliary: Mild hepatomegaly, with liver measuring 21.4 cm in craniocaudal length. No focal liver abnormalities. No intrahepatic duct dilation. Multiple gallstones are identified dependently within the gallbladder neck. No evidence of acute cholecystitis. No gallbladder wall thickening. MRCP images demonstrate normal caliber of the common bile duct measuring up to 4 mm. No filling defects or choledocholithiasis. Pancreas: Mild peripancreatic edema surrounding the head and uncinate process consistent with given history of acute pancreatitis. Normal enhancement of the pancreatic parenchyma. No fluid collection, pseudocyst, or abscess. No pancreatic duct dilation. Spleen:  Within normal limits in size and appearance. Adrenals/Urinary Tract: Right kidney is atrophic, with a small cyst off the lower pole. There is compensatory hypertrophy of the left kidney, without focal abnormality. The adrenals are grossly normal. Stomach/Bowel: Scattered diverticulosis of the colon. No bowel obstruction or ileus. No bowel wall thickening or inflammatory change. Vascular/Lymphatic: No pathologically enlarged lymph nodes identified. No abdominal aortic aneurysm demonstrated. Other: Trace free fluid surrounding the duodenal sweep. No other free fluid or free gas. No abdominal wall hernia. Musculoskeletal: No suspicious bone  lesions identified. IMPRESSION: 1. Cholelithiasis.  No evidence of acute cholecystitis. 2. No evidence of choledocholithiasis. Normal appearance of the biliary tree. 3. Mild peripancreatic edema surrounding the head and uncinate process, consistent with acute uncomplicated pancreatitis. No fluid collection, pseudocyst, or abscess. 4. Mild hepatomegaly. 5. Marked right renal cortical atrophy. Compensatory hypertrophy left kidney. 6. Colonic diverticulosis without diverticulitis. Electronically Signed   By: Sharlet Salina M.D.   On: 07/10/2020  03:55   US ABDOMEN LIMITED RUQ (LIVER/GB)  Result Date: 07/09/2020 CLINICAL DATA:  Abdominal pain. EXAM: ULTRASOUND ABDOMEN LIMITED RIGHT UPPER QUADRANT COMPARISON:  CT earlier today. FINDINGS: Gallbladder: Distended with stones at the gallbladder neck. Suspected layering sludge. No wall thickening or pericholecystic fluid. No sonographic Murphy sign noted by sonographer. Common bile duct: Diameter: 3 mm at the porta hepatis, not well visualized distally Liver: Heterogeneous echogenicity with likely steatosis. The right kidney is markedly atrophic for comparison. No evidence of focal lesion. Portal vein is patent on color Doppler imaging with normal direction of blood flow towards the liver. Other: No right upper quadrant ascites. IMPRESSION: 1. Distended gallbladder with gallstones but no sonographic findings of acute cholecystitis. 2. Heterogeneous liver echogenicity with likely steatosis. Electronically Signed   By: Narda Rutherford M.D.   On: 07/09/2020 23:38   MR ABDOMEN MRCP W WO CONTAST  Final Result    MR 3D Recon At Scanner  Final Result    US ABDOMEN LIMITED RUQ (LIVER/GB)  Final Result    CT ABDOMEN PELVIS WO CONTRAST  Final Result      Scheduled Meds: . enoxaparin (LOVENOX) injection  40 mg Subcutaneous Q24H  . folic acid  1 mg Oral Daily  . irbesartan  150 mg Oral Daily  . multivitamin with minerals  1 tablet Oral Daily  . thiamine  100 mg Oral Daily   Or  . thiamine  100 mg Intravenous Daily   PRN Meds: acetaminophen **OR** acetaminophen, LORazepam **OR** LORazepam, morphine injection, ondansetron **OR** ondansetron (ZOFRAN) IV, phenol Continuous Infusions: . sodium chloride 100 mL/hr at 07/11/20 1113     LOS: 1 day  Time spent: Greater than 50% of the 35 minute visit was spent in counseling/coordination of care for the patient as laid out in the A&P.   Lewie Chamber, MD [facility] 07/11/2020, 1:19 PM

## 2020-07-12 DIAGNOSIS — I10 Essential (primary) hypertension: Secondary | ICD-10-CM

## 2020-07-12 LAB — CBC WITH DIFFERENTIAL/PLATELET
Abs Immature Granulocytes: 0.02 10*3/uL (ref 0.00–0.07)
Basophils Absolute: 0 10*3/uL (ref 0.0–0.1)
Basophils Relative: 1 %
Eosinophils Absolute: 0.4 10*3/uL (ref 0.0–0.5)
Eosinophils Relative: 5 %
HCT: 41 % (ref 39.0–52.0)
Hemoglobin: 14.4 g/dL (ref 13.0–17.0)
Immature Granulocytes: 0 %
Lymphocytes Relative: 33 %
Lymphs Abs: 2.1 10*3/uL (ref 0.7–4.0)
MCH: 30.5 pg (ref 26.0–34.0)
MCHC: 35.1 g/dL (ref 30.0–36.0)
MCV: 86.9 fL (ref 80.0–100.0)
Monocytes Absolute: 0.6 10*3/uL (ref 0.1–1.0)
Monocytes Relative: 10 %
Neutro Abs: 3.3 10*3/uL (ref 1.7–7.7)
Neutrophils Relative %: 51 %
Platelets: 136 10*3/uL — ABNORMAL LOW (ref 150–400)
RBC: 4.72 MIL/uL (ref 4.22–5.81)
RDW: 12.3 % (ref 11.5–15.5)
WBC: 6.5 10*3/uL (ref 4.0–10.5)
nRBC: 0 % (ref 0.0–0.2)

## 2020-07-12 LAB — COMPREHENSIVE METABOLIC PANEL
ALT: 30 U/L (ref 0–44)
AST: 42 U/L — ABNORMAL HIGH (ref 15–41)
Albumin: 3.5 g/dL (ref 3.5–5.0)
Alkaline Phosphatase: 88 U/L (ref 38–126)
Anion gap: 9 (ref 5–15)
BUN: 21 mg/dL — ABNORMAL HIGH (ref 6–20)
CO2: 23 mmol/L (ref 22–32)
Calcium: 8.7 mg/dL — ABNORMAL LOW (ref 8.9–10.3)
Chloride: 103 mmol/L (ref 98–111)
Creatinine, Ser: 0.92 mg/dL (ref 0.61–1.24)
GFR, Estimated: >60 mL/min (ref >60)
Glucose, Bld: 95 mg/dL (ref 70–99)
Potassium: 3.5 mmol/L (ref 3.5–5.1)
Sodium: 135 mmol/L (ref 135–145)
Total Bilirubin: 1.1 mg/dL (ref 0.3–1.2)
Total Protein: 6.8 g/dL (ref 6.5–8.1)

## 2020-07-12 LAB — MAGNESIUM: Magnesium: 1.9 mg/dL (ref 1.7–2.4)

## 2020-07-12 LAB — PROTIME-INR
INR: 1.2 (ref 0.8–1.2)
Prothrombin Time: 15 seconds (ref 11.4–15.2)

## 2020-07-12 NOTE — Discharge Summary (Signed)
Physician Discharge Summary   Joshua Yoder PYK:[id number] DOB: March 24, 1980 DOA: 07/09/2020  PCP: Oneita Hurt, No  Admit date: 07/09/2020 Discharge date: 07/12/2020   Admitted From: home Disposition:  home Discharging physician: Lewie Chamber, MD  Recommendations for Outpatient Follow-up:  1. Encourage decreasing alcohol use   Patient discharged to home in Discharge Condition: stable Risk of unplanned readmission score: Unplanned Admission- Pilot do not use: 13.13  CODE STATUS: Full Diet recommendation:  Diet Orders (From admission, onward)    Start     Ordered   07/12/20 0611  DIET SOFT Room service appropriate? No; Fluid consistency: Thin  Diet effective now       Question Answer Comment  Room service appropriate? No   Fluid consistency: Thin      07/12/20 0611   07/12/20 0000  Diet - low sodium heart healthy        07/12/20 1028          Hospital Course:  Joshua Heintzelman Gonzalezis a 40 y.o.malewith medical history significant forhypertension who presented to the ER for evaluation of abdominal pain mostly in the mid abdomen associated with nausea and multiple episodes of vomiting.  He rated his pain a 6 x 10 in intensity at its worst associated with nausea, anorexia and vomiting.  Patient endorsed almost daily alcohol use at home, approximately 3 beers per day.  He states it had been approximately 1 week since his last alcoholic beverage upon admission. He underwent further work-up with CT abdomen/pelvis followed by right upper quadrant ultrasound and then MRCP.  Ultimately he was found to have pancreatitis with mild peripancreatic edema. Lipase was also elevated of note on admission.  Physical exam also was consistent with pancreatitis. Imaging studies also noted cholelithiasis but no acute cholecystitis or choledocholithiasis. Liver imaging also showed steatosis.   He was admitted for pancreatitis treatment and monitoring for any alcohol withdrawal. He had  good clinical improvement in regards to his pancreatitis.  Diet was advanced slowly which he tolerated well with no nausea, vomiting, or abdominal pain.  He had no signs of alcohol withdrawal during monitoring in the hospital as well.  He was discharged home in stable condition and recommended to continue cutting back on alcohol use.   The patient's chronic medical conditions were treated accordingly per the patient's home medication regimen except as noted.  On day of discharge, patient was felt deemed stable for discharge. Patient/family member advised to call PCP or come back to ER if needed.   Principal Diagnosis: Alcoholic pancreatitis  Discharge Diagnoses: Active Hospital Problems   Diagnosis Date Noted  . Cholelithiasis 07/10/2020  . Essential hypertension 07/10/2020  . Alcohol dependence (HCC) 07/10/2020    Resolved Hospital Problems   Diagnosis Date Noted Date Resolved  . Alcoholic pancreatitis 07/10/2020 07/12/2020  . Transaminitis 07/10/2020 07/12/2020    Discharge Instructions    Diet - low sodium heart healthy   Complete by: As directed    Increase activity slowly   Complete by: As directed      Allergies as of 07/12/2020   No Known Allergies     Medication List    TAKE these medications   Micardis 40 MG tablet Generic drug: telmisartan Take 40 mg by mouth daily.       No Known Allergies  Consultations: n/a  Discharge Exam: BP (!) 127/96 (BP Location: Right Arm)   Pulse 66   Temp 98.6 F (37 C) (Oral)   Resp 20   Ht 5\' 7"  (1.702  m)   Wt 79.4 kg   SpO2 97%   BMI 27.41 kg/m  General appearance: alert, cooperative and no distress Head: Normocephalic, without obvious abnormality, atraumatic Eyes: EOMI Lungs: clear to auscultation bilaterally Heart: regular rate and rhythm and S1, S2 normal Abdomen: Mildly obese, soft, nontender, nondistended, bowel sounds present Extremities: No edema Skin: mobility and turgor normal Neurologic: No focal  deficits.  No tremor appreciated  The results of significant diagnostics from this hospitalization (including imaging, microbiology, ancillary and laboratory) are listed below for reference.   Microbiology: Recent Results (from the past 240 hour(s))  Resp Panel by RT-PCR (Flu A&B, Covid) Nasopharyngeal Swab     Status: None   Collection Time: 07/09/20 11:34 PM   Specimen: Nasopharyngeal Swab; Nasopharyngeal(NP) swabs in vial transport medium  Result Value Ref Range Status   SARS Coronavirus 2 by RT PCR NEGATIVE NEGATIVE Final    Comment: (NOTE) SARS-CoV-2 target nucleic acids are NOT DETECTED.  The SARS-CoV-2 RNA is generally detectable in upper respiratory specimens during the acute phase of infection. The lowest concentration of SARS-CoV-2 viral copies this assay can detect is 138 copies/mL. A negative result does not preclude SARS-Cov-2 infection and should not be used as the sole basis for treatment or other patient management decisions. A negative result may occur with  improper specimen collection/handling, submission of specimen other than nasopharyngeal swab, presence of viral mutation(s) within the areas targeted by this assay, and inadequate number of viral copies(<138 copies/mL). A negative result must be combined with clinical observations, patient history, and epidemiological information. The expected result is Negative.  Fact Sheet for Patients:  BloggerCourse.com  Fact Sheet for Healthcare Providers:  SeriousBroker.it  This test is no t yet approved or cleared by the [place] FDA and  has been authorized for detection and/or diagnosis of SARS-CoV-2 by FDA under an Emergency Use Authorization (EUA). This EUA will remain  in effect (meaning this test can be used) for the duration of the COVID-19 declaration under Section 564(b)(1) of the Act, 21 U.S.C.section 360bbb-3(b)(1), unless the authorization is terminated   or revoked sooner.       Influenza A by PCR NEGATIVE NEGATIVE Final   Influenza B by PCR NEGATIVE NEGATIVE Final    Comment: (NOTE) The Xpert Xpress SARS-CoV-2/FLU/RSV plus assay is intended as an aid in the diagnosis of influenza from Nasopharyngeal swab specimens and should not be used as a sole basis for treatment. Nasal washings and aspirates are unacceptable for Xpert Xpress SARS-CoV-2/FLU/RSV testing.  Fact Sheet for Patients: BloggerCourse.com  Fact Sheet for Healthcare Providers: SeriousBroker.it  This test is not yet approved or cleared by the [place] FDA and has been authorized for detection and/or diagnosis of SARS-CoV-2 by FDA under an Emergency Use Authorization (EUA). This EUA will remain in effect (meaning this test can be used) for the duration of the COVID-19 declaration under Section 564(b)(1) of the Act, 21 U.S.C. section 360bbb-3(b)(1), unless the authorization is terminated or revoked.  Performed at [facility], [address] [place], [place] [address]      Labs: BNP (last 3 results) No results for input(s): BNP in the last 8760 hours. Basic Metabolic Panel: Recent Labs  Lab 07/09/20 1751 07/10/20 0636 07/11/20 0456 07/12/20 0351  NA 132*  --  136 135  K 3.8  --  3.4* 3.5  CL 97*  --  102 103  CO2 20*  --  26 23  GLUCOSE 130*  --  87 95  BUN 15  --  22* 21*  CREATININE 1.41*  --  1.21 0.92  CALCIUM 9.3  --  9.2 8.7*  MG 1.9 1.8  --  1.9  PHOS 4.6  --   --   --    Liver Function Tests: Recent Labs  Lab 07/09/20 1751 07/11/20 0456 07/12/20 0351  AST 85* 43* 42*  ALT 47* 29 30  ALKPHOS 104 83 88  BILITOT 2.3* 2.1* 1.1  PROT 8.6* 7.4 6.8  ALBUMIN 4.5 3.6 3.5   Recent Labs  Lab 07/09/20 1751 07/11/20 0456 07/11/20 1550  LIPASE 115* 143* 123*   No results for input(s): AMMONIA in the last 168 hours. CBC: Recent Labs  Lab 07/09/20 1751 07/11/20 0456  07/12/20 0351  WBC 11.8* 7.5 6.5  NEUTROABS  --   --  3.3  HGB 17.7* 14.7 14.4  HCT 49.0 40.9 41.0  MCV 83.8 86.3 86.9  PLT 240 130* 136*   Cardiac Enzymes: No results for input(s): CKTOTAL, CKMB, CKMBINDEX, TROPONINI in the last 168 hours. BNP: Invalid input(s): POCBNP CBG: Recent Labs  Lab 07/10/20 0829 07/10/20 1357  GLUCAP 124* 114*   D-Dimer No results for input(s): DDIMER in the last 72 hours. Hgb A1c No results for input(s): HGBA1C in the last 72 hours. Lipid Profile No results for input(s): CHOL, HDL, LDLCALC, TRIG, CHOLHDL, LDLDIRECT in the last 72 hours. Thyroid function studies No results for input(s): TSH, T4TOTAL, T3FREE, THYROIDAB in the last 72 hours.  Invalid input(s): FREET3 Anemia work up No results for input(s): VITAMINB12, FOLATE, FERRITIN, TIBC, IRON, RETICCTPCT in the last 72 hours. Urinalysis No results found for: COLORURINE, APPEARANCEUR, LABSPEC, PHURINE, GLUCOSEU, HGBUR, BILIRUBINUR, KETONESUR, PROTEINUR, UROBILINOGEN, NITRITE, LEUKOCYTESUR Sepsis Labs Invalid input(s): PROCALCITONIN,  WBC,  LACTICIDVEN Microbiology Recent Results (from the past 240 hour(s))  Resp Panel by RT-PCR (Flu A&B, Covid) Nasopharyngeal Swab     Status: None   Collection Time: 07/09/20 11:34 PM   Specimen: Nasopharyngeal Swab; Nasopharyngeal(NP) swabs in vial transport medium  Result Value Ref Range Status   SARS Coronavirus 2 by RT PCR NEGATIVE NEGATIVE Final    Comment: (NOTE) SARS-CoV-2 target nucleic acids are NOT DETECTED.  The SARS-CoV-2 RNA is generally detectable in upper respiratory specimens during the acute phase of infection. The lowest concentration of SARS-CoV-2 viral copies this assay can detect is 138 copies/mL. A negative result does not preclude SARS-Cov-2 infection and should not be used as the sole basis for treatment or other patient management decisions. A negative result may occur with  improper specimen collection/handling, submission of  specimen other than nasopharyngeal swab, presence of viral mutation(s) within the areas targeted by this assay, and inadequate number of viral copies(<138 copies/mL). A negative result must be combined with clinical observations, patient history, and epidemiological information. The expected result is Negative.  Fact Sheet for Patients:  BloggerCourse.com  Fact Sheet for Healthcare Providers:  SeriousBroker.it  This test is no t yet approved or cleared by the [place] FDA and  has been authorized for detection and/or diagnosis of SARS-CoV-2 by FDA under an Emergency Use Authorization (EUA). This EUA will remain  in effect (meaning this test can be used) for the duration of the COVID-19 declaration under Section 564(b)(1) of the Act, 21 U.S.C.section 360bbb-3(b)(1), unless the authorization is terminated  or revoked sooner.       Influenza A by PCR NEGATIVE NEGATIVE Final   Influenza B by PCR NEGATIVE NEGATIVE Final    Comment: (NOTE) The Xpert  Xpress SARS-CoV-2/FLU/RSV plus assay is intended as an aid in the diagnosis of influenza from Nasopharyngeal swab specimens and should not be used as a sole basis for treatment. Nasal washings and aspirates are unacceptable for Xpert Xpress SARS-CoV-2/FLU/RSV testing.  Fact Sheet for Patients: BloggerCourse.com  Fact Sheet for Healthcare Providers: SeriousBroker.it  This test is not yet approved or cleared by the [place] FDA and has been authorized for detection and/or diagnosis of SARS-CoV-2 by FDA under an Emergency Use Authorization (EUA). This EUA will remain in effect (meaning this test can be used) for the duration of the COVID-19 declaration under Section 564(b)(1) of the Act, 21 U.S.C. section 360bbb-3(b)(1), unless the authorization is terminated or revoked.  Performed at [facility], [address] [place], [place] [address]     Procedures/Studies: CT ABDOMEN PELVIS WO CONTRAST  Result Date: 07/09/2020 CLINICAL DATA:  Vomiting right-sided abdominal pain EXAM: CT ABDOMEN AND PELVIS WITHOUT CONTRAST TECHNIQUE: Multidetector CT imaging of the abdomen and pelvis was performed following the standard protocol without IV contrast. COMPARISON:  None. FINDINGS: Lower chest: Lung bases demonstrate no acute consolidation or effusion. Mild elevation of right diaphragm. Hepatobiliary: Liver slightly enlarged at 21 cm. Possible subtle contour nodularity. Gallbladder slightly dilated. Small stones within the gallbladder neck and possibly the cystic duct. No right upper quadrant inflammatory change. Pancreas: Unremarkable. No pancreatic ductal dilatation or surrounding inflammatory changes. Spleen: Normal in size without focal abnormality. Adrenals/Urinary Tract: Adrenal glands are normal. Atrophic right kidney with punctate stone at the upper pole. Exophytic low-attenuation lesion off the lower pole probably a cyst. No hydronephrosis. The bladder is normal Stomach/Bowel: Stomach is within normal limits. Appendix appears normal. No evidence of bowel wall thickening, distention, or inflammatory changes. Diverticular disease of the colon without acute wall thickening. Vascular/Lymphatic: Mild aortic atherosclerosis. No aneurysm. No suspicious nodes Reproductive: Prostate is unremarkable. Other: Negative for free air or free fluid. Left fat containing inguinal hernia Musculoskeletal: No acute or significant osseous findings. IMPRESSION: 1. Slightly enlarged liver. Possible subtle contour nodularity of the liver as may be seen with cirrhosis. 2. Slightly distended gallbladder containing stones. Stones appear to be within gallbladder neck and possibly cystic duct. Correlation with right upper quadrant ultrasound may be obtained if symptoms are referable to the gallbladder 3. Atrophic right kidney with stone.  No  hydronephrosis 4. 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   On: 07/09/2020 22:21   MR 3D Recon At Scanner  Result Date: 07/10/2020 CLINICAL DATA:  Cholelithiasis, pancreatitis EXAM: MRI ABDOMEN WITHOUT AND WITH CONTRAST (INCLUDING MRCP) TECHNIQUE: Multiplanar multisequence MR imaging of the abdomen was performed both before and after the administration of intravenous contrast. Heavily T2-weighted images of the biliary and pancreatic ducts were obtained, and three-dimensional MRCP images were rendered by post processing. CONTRAST:  7.2mL GADAVIST GADOBUTROL 1 MMOL/ML IV SOLN COMPARISON:  07/09/2020 FINDINGS: Lower chest: No acute pleural or parenchymal lung disease. Hepatobiliary: Mild hepatomegaly, with liver measuring 21.4 cm in craniocaudal length. No focal liver abnormalities. No intrahepatic duct dilation. Multiple gallstones are identified dependently within the gallbladder neck. No evidence of acute cholecystitis. No gallbladder wall thickening. MRCP images demonstrate normal caliber of the common bile duct measuring up to 4 mm. No filling defects or choledocholithiasis. Pancreas: Mild peripancreatic edema surrounding the head and uncinate process consistent with given history of acute pancreatitis. Normal enhancement of the pancreatic parenchyma. No fluid collection, pseudocyst, or abscess. No pancreatic  duct dilation. Spleen:  Within normal limits in size and appearance. Adrenals/Urinary Tract: Right kidney is atrophic, with a small cyst off the lower pole. There is compensatory hypertrophy of the left kidney, without focal abnormality. The adrenals are grossly normal. Stomach/Bowel: Scattered diverticulosis of the colon. No bowel obstruction or ileus. No bowel wall thickening or inflammatory change. Vascular/Lymphatic: No pathologically enlarged lymph nodes identified. No abdominal aortic aneurysm demonstrated. Other: Trace free  fluid surrounding the duodenal sweep. No other free fluid or free gas. No abdominal wall hernia. Musculoskeletal: No suspicious bone lesions identified. IMPRESSION: 1. Cholelithiasis.  No evidence of acute cholecystitis. 2. No evidence of choledocholithiasis. Normal appearance of the biliary tree. 3. Mild peripancreatic edema surrounding the head and uncinate process, consistent with acute uncomplicated pancreatitis. No fluid collection, pseudocyst, or abscess. 4. Mild hepatomegaly. 5. Marked right renal cortical atrophy. Compensatory hypertrophy left kidney. 6. Colonic diverticulosis without diverticulitis. Electronically Signed   By: Sharlet SalinaMichael  Brown M.D.   On: 07/10/2020 03:55   MR ABDOMEN MRCP W WO CONTAST  Result Date: 07/10/2020 CLINICAL DATA:  Cholelithiasis, pancreatitis EXAM: MRI ABDOMEN WITHOUT AND WITH CONTRAST (INCLUDING MRCP) TECHNIQUE: Multiplanar multisequence MR imaging of the abdomen was performed both before and after the administration of intravenous contrast. Heavily T2-weighted images of the biliary and pancreatic ducts were obtained, and three-dimensional MRCP images were rendered by post processing. CONTRAST:  7.265mL GADAVIST GADOBUTROL 1 MMOL/ML IV SOLN COMPARISON:  07/09/2020 FINDINGS: Lower chest: No acute pleural or parenchymal lung disease. Hepatobiliary: Mild hepatomegaly, with liver measuring 21.4 cm in craniocaudal length. No focal liver abnormalities. No intrahepatic duct dilation. Multiple gallstones are identified dependently within the gallbladder neck. No evidence of acute cholecystitis. No gallbladder wall thickening. MRCP images demonstrate normal caliber of the common bile duct measuring up to 4 mm. No filling defects or choledocholithiasis. Pancreas: Mild peripancreatic edema surrounding the head and uncinate process consistent with given history of acute pancreatitis. Normal enhancement of the pancreatic parenchyma. No fluid collection, pseudocyst, or abscess. No pancreatic  duct dilation. Spleen:  Within normal limits in size and appearance. Adrenals/Urinary Tract: Right kidney is atrophic, with a small cyst off the lower pole. There is compensatory hypertrophy of the left kidney, without focal abnormality. The adrenals are grossly normal. Stomach/Bowel: Scattered diverticulosis of the colon. No bowel obstruction or ileus. No bowel wall thickening or inflammatory change. Vascular/Lymphatic: No pathologically enlarged lymph nodes identified. No abdominal aortic aneurysm demonstrated. Other: Trace free fluid surrounding the duodenal sweep. No other free fluid or free gas. No abdominal wall hernia. Musculoskeletal: No suspicious bone lesions identified. IMPRESSION: 1. Cholelithiasis.  No evidence of acute cholecystitis. 2. No evidence of choledocholithiasis. Normal appearance of the biliary tree. 3. Mild peripancreatic edema surrounding the head and uncinate process, consistent with acute uncomplicated pancreatitis. No fluid collection, pseudocyst, or abscess. 4. Mild hepatomegaly. 5. Marked right renal cortical atrophy. Compensatory hypertrophy left kidney. 6. Colonic diverticulosis without diverticulitis. Electronically Signed   By: Sharlet SalinaMichael  Brown M.D.   On: 07/10/2020 03:55   US ABDOMEN LIMITED RUQ (LIVER/GB)  Result Date: 07/09/2020 CLINICAL DATA:  Abdominal pain. EXAM: ULTRASOUND ABDOMEN LIMITED RIGHT UPPER QUADRANT COMPARISON:  CT earlier today. FINDINGS: Gallbladder: Distended with stones at the gallbladder neck. Suspected layering sludge. No wall thickening or pericholecystic fluid. No sonographic Murphy sign noted by sonographer. Common bile duct: Diameter: 3 mm at the porta hepatis, not well visualized distally Liver: Heterogeneous echogenicity with likely steatosis. The right kidney is markedly atrophic for comparison. No evidence of focal  lesion. Portal vein is patent on color Doppler imaging with normal direction of blood flow towards the liver. Other: No right upper  quadrant ascites. IMPRESSION: 1. Distended gallbladder with gallstones but no sonographic findings of acute cholecystitis. 2. Heterogeneous liver echogenicity with likely steatosis. Electronically Signed   By: Narda Rutherford M.D.   On: 07/09/2020 23:38     Time coordinating discharge: Over 30 minutes    Lewie Chamber, MD  [facility] 07/12/2020, 2:45 PM

## 2020-07-12 NOTE — Progress Notes (Signed)
Went through discharge paper with the patient with the help of interpreter. No further question. Pt left POV with his parents

## 2022-11-06 ENCOUNTER — Emergency Department
Admission: EM | Admit: 2022-11-06 | Discharge: 2022-11-07 | Disposition: A | Discharge status: Home / Self Care | Payer: Self-pay | Attending: Emergency Medicine | Admitting: Emergency Medicine

## 2022-11-06 ENCOUNTER — Other Ambulatory Visit: Payer: Self-pay

## 2022-11-06 DIAGNOSIS — R7401 Elevation of levels of liver transaminase levels: Secondary | ICD-10-CM | POA: Insufficient documentation

## 2022-11-06 DIAGNOSIS — K2920 Alcoholic gastritis without bleeding: Secondary | ICD-10-CM | POA: Insufficient documentation

## 2022-11-06 DIAGNOSIS — F1010 Alcohol abuse, uncomplicated: Secondary | ICD-10-CM | POA: Insufficient documentation

## 2022-11-06 DIAGNOSIS — R748 Abnormal levels of other serum enzymes: Secondary | ICD-10-CM | POA: Insufficient documentation

## 2022-11-06 DIAGNOSIS — I10 Essential (primary) hypertension: Secondary | ICD-10-CM | POA: Insufficient documentation

## 2022-11-06 DIAGNOSIS — Y908 Blood alcohol level of 240 mg/100 ml or more: Secondary | ICD-10-CM | POA: Insufficient documentation

## 2022-11-06 LAB — LIPASE, BLOOD: Lipase: 76 U/L — ABNORMAL HIGH (ref 11–51)

## 2022-11-06 LAB — COMPREHENSIVE METABOLIC PANEL
ALT: 59 U/L — ABNORMAL HIGH (ref 0–44)
AST: 135 U/L — ABNORMAL HIGH (ref 15–41)
Albumin: 3.6 g/dL (ref 3.5–5.0)
Alkaline Phosphatase: 110 U/L (ref 38–126)
Anion gap: 15 (ref 5–15)
BUN: <5 mg/dL — ABNORMAL LOW (ref 6–20)
CO2: 19 mmol/L — ABNORMAL LOW (ref 22–32)
Calcium: 8.3 mg/dL — ABNORMAL LOW (ref 8.9–10.3)
Chloride: 97 mmol/L — ABNORMAL LOW (ref 98–111)
Creatinine, Ser: 0.9 mg/dL (ref 0.61–1.24)
GFR, Estimated: >60 mL/min (ref >60)
Glucose, Bld: 174 mg/dL — ABNORMAL HIGH (ref 70–99)
Potassium: 3.2 mmol/L — ABNORMAL LOW (ref 3.5–5.1)
Sodium: 131 mmol/L — ABNORMAL LOW (ref 135–145)
Total Bilirubin: 1.2 mg/dL (ref 0.3–1.2)
Total Protein: 7.7 g/dL (ref 6.5–8.1)

## 2022-11-06 LAB — CBC
HCT: 52.6 % — ABNORMAL HIGH (ref 39.0–52.0)
Hemoglobin: 19 g/dL — ABNORMAL HIGH (ref 13.0–17.0)
MCH: 29.1 pg (ref 26.0–34.0)
MCHC: 36.1 g/dL — ABNORMAL HIGH (ref 30.0–36.0)
MCV: 80.4 fL (ref 80.0–100.0)
Platelets: 254 10*3/uL (ref 150–400)
RBC: 6.54 MIL/uL — ABNORMAL HIGH (ref 4.22–5.81)
RDW: 11.9 % (ref 11.5–15.5)
WBC: 7.8 10*3/uL (ref 4.0–10.5)
nRBC: 0 % (ref 0.0–0.2)

## 2022-11-06 LAB — GROUP A STREP BY PCR: Group A Strep by PCR: NOT DETECTED

## 2022-11-06 LAB — ETHANOL: Alcohol, Ethyl (B): 395 mg/dL (ref <10)

## 2022-11-06 MED ORDER — CHLORDIAZEPOXIDE HCL 25 MG PO CAPS
50.0000 mg | ORAL_CAPSULE | Freq: Once | ORAL | Status: AC
  Administered 2022-11-06: 50 mg via ORAL
  Filled 2022-11-06: qty 2

## 2022-11-06 MED ORDER — CHLORDIAZEPOXIDE HCL 25 MG PO CAPS: 25.0000 mg | ORAL_CAPSULE | Freq: Three times a day (TID) | ORAL | 0 refills | Status: AC | PRN

## 2022-11-06 MED ORDER — METOCLOPRAMIDE HCL 10 MG PO TABS: 10.0000 mg | ORAL_TABLET | Freq: Three times a day (TID) | ORAL | 0 refills | Status: AC | PRN

## 2022-11-06 MED ORDER — ONDANSETRON HCL 4 MG/2ML IJ SOLN
4.0000 mg | Freq: Once | INTRAMUSCULAR | Status: AC
  Administered 2022-11-06: 4 mg via INTRAVENOUS
  Filled 2022-11-06: qty 2

## 2022-11-06 MED ORDER — SODIUM CHLORIDE 0.9 % IV BOLUS
1000.0000 mL | Freq: Once | INTRAVENOUS | Status: AC
  Administered 2022-11-06: 1000 mL via INTRAVENOUS

## 2022-11-06 NOTE — Discharge Instructions (Addendum)
Regrese a la sala de emergencias si presenta vmitos intensos nuevos, que empeoran o persisten, dolor abdominal, fiebre, dolor de garganta, debilidad, temblores, alucinaciones o cualquier otro sntoma nuevo o que le preocupa que empeore.  Hemos proporcionado informacin para los centros locales de tratamiento de alcohol y sustancias.

## 2022-11-06 NOTE — ED Triage Notes (Signed)
Pt to ED for HTN and ETOH. States got out of rehab in December, has been drinking for the past 5 days. Has been vomiting and diarrhea today.  Pt responsive to verbal stimuli.

## 2022-11-06 NOTE — ED Notes (Signed)
Patient resting, visitor at the bedside.

## 2022-11-06 NOTE — ED Provider Notes (Signed)
[facility] Provider Note    Event Date/Time   First MD Initiated Contact with Patient 11/06/22 1753     (approximate)   History   Alcohol Intoxication and Emesis   HPI  PI obtained via video interpreter  Varian Wind is a 42 y.o. male with a history of hypertension and alcohol abuse who presents with vomiting and sore throat in the context of heavy alcohol use.  The patient states that he has been drinking heavily for the last 5 days.  He has had vomiting and diarrhea today but denies associate abdominal pain.  He does report sore throat.  He states he wants medication for sleep.  I reviewed the past medical records.  The patient's most recent encounter here was an admission to the hospitalist service in 2022 for alcohol induced pancreatitis.  Physical Exam   Triage Vital Signs: ED Triage Vitals  Encounter Vitals Group     BP 11/06/22 1746 (!) 139/109     Systolic BP Percentile --      Diastolic BP Percentile --      Pulse Rate 11/06/22 1746 (!) 125     Resp 11/06/22 1746 20     Temp 11/06/22 1746 98.6 F (37 C)     Temp src --      SpO2 11/06/22 1746 98 %     Weight 11/06/22 1747 245 lb (111.1 kg)     Height 11/06/22 1747 5\' 10"  (1.778 m)     Head Circumference --      Peak Flow --      Pain Score 11/06/22 1747 0     Pain Loc --      Pain Education --      Exclude from Growth Chart --     Most recent vital signs: Vitals:   11/06/22 2330 11/07/22 0000  BP: 121/86 113/80  Pulse: (!) 110 (!) 105  Resp:    Temp:    SpO2: 92% 92%     General: Awake, intoxicated appearing, no distress.  CV:  Good peripheral perfusion.  Resp:  Normal effort.  Abd:  Soft and nontender.  No distention.  Other:  Oropharynx with slight erythema but no exudates.  Dry mucous membranes.  EOMI.  PERRLA.  Motor intact in all extremities.   ED Results / Procedures / Treatments   Labs (all labs ordered are listed, but only abnormal results are  displayed) Labs Reviewed  LIPASE, BLOOD - Abnormal; Notable for the following components:      Result Value   Lipase 76 (*)    All other components within normal limits  COMPREHENSIVE METABOLIC PANEL - Abnormal; Notable for the following components:   Sodium 131 (*)    Potassium 3.2 (*)    Chloride 97 (*)    CO2 19 (*)    Glucose, Bld 174 (*)    BUN <5 (*)    Calcium 8.3 (*)    AST 135 (*)    ALT 59 (*)    All other components within normal limits  CBC - Abnormal; Notable for the following components:   RBC 6.54 (*)    Hemoglobin 19.0 (*)    HCT 52.6 (*)    MCHC 36.1 (*)    All other components within normal limits  ETHANOL - Abnormal; Notable for the following components:   Alcohol, Ethyl (B) 395 (*)    All other components within normal limits  GROUP A STREP BY PCR  EKG   RADIOLOGY   PROCEDURES:  Critical Care performed: No  Procedures   MEDICATIONS ORDERED IN ED: Medications  sodium chloride 0.9 % bolus 1,000 mL (1,000 mLs Intravenous Bolus 11/06/22 2051)  ondansetron (ZOFRAN) injection 4 mg (4 mg Intravenous Given 11/06/22 2050)  chlordiazePOXIDE (LIBRIUM) capsule 50 mg (50 mg Oral Given 11/06/22 2051)     IMPRESSION / MDM / ASSESSMENT AND PLAN / ED COURSE  I reviewed the triage vital signs and the nursing notes.  42 year old male with PMH as noted above presents with vomiting, diarrhea, and sore throat in the context of heavy alcohol use.  On exam the patient is alert but somewhat intoxicated appearing.  He was initially tachycardic but other vital signs are normal.  He has no significant tremor or tongue fasciculation.  Abdomen soft and nontender.  Differential diagnosis includes, but is not limited to, alcohol intoxication, gastritis, gastroparesis, gastroenteritis, pancreatitis, other hepatobiliary etiology.  We will obtain lab workup, strep swab, give fluids, and reassess.  At this time the patient does not appear to be in active alcohol withdrawal  states his last drink was almost 8 hours ago so I will give a dose of Librium.  Patient's presentation is most consistent with acute complicated illness / injury requiring diagnostic workup.  The patient is on the cardiac monitor to evaluate for evidence of arrhythmia and/or significant heart rate changes.  ----------------------------------------- 11:17 PM on 11/06/2022 -----------------------------------------  Lab workup is significant for alcohol level of 395.  Electrolytes show minor abnormalities but no concerning acute findings.  AST and ALT are slightly elevated but bilirubin is normal.  Lipase is also minimally elevated.  Strep swab is negative.  On reassessment, the patient appears well.  He states he is feeling significantly better.  He is no longer vomiting and was able to tolerate p.o. liquids.  His vital signs are stable.  His heart rate is right around 100, but blood pressure is normal.  The patient continues to have no tremor or tongue fasciculation.  He is alert and oriented with no signs of alcohol withdrawal.  I did consider whether he may benefit from inpatient admission for further fluids and monitoring, however since he is not having any acute symptoms of withdrawal, is tolerating p.o., and states he is feeling better, he is appropriate for discharge home.  I counseled him via the Spanish interpreter and answered all of his questions.  I have prescribed Reglan for nausea as well as a short course of Librium for home.  I have given him outpatient substance abuse resources.  I gave strict return precautions and he expressed understanding.   FINAL CLINICAL IMPRESSION(S) / ED DIAGNOSES   Final diagnoses:  Alcohol abuse  Acute alcoholic gastritis without hemorrhage     Rx / DC Orders   ED Discharge Orders          Ordered    metoCLOPramide (REGLAN) 10 MG tablet  Every 8 hours PRN        11/06/22 2319    chlordiazePOXIDE (LIBRIUM) 25 MG capsule  3 times daily PRN         11/06/22 2319             Note:  This document was prepared using Dragon voice recognition software and may include unintentional dictation errors.    Dionne Bucy, MD 11/07/22 [phone]
